# Patient Record
Sex: Female | Born: 1980 | Race: Black or African American | Hispanic: No | Marital: Married | Smoking: Never smoker
Health system: Southern US, Community
[De-identification: ages and names within clinical notes are randomized; demographics above are authoritative.]

## PROBLEM LIST (undated history)

## (undated) DIAGNOSIS — E669 Obesity, unspecified: Secondary | ICD-10-CM

## (undated) DIAGNOSIS — G4730 Sleep apnea, unspecified: Secondary | ICD-10-CM

## (undated) HISTORY — PX: TUBAL LIGATION: SHX77

## (undated) HISTORY — PX: OTHER SURGICAL HISTORY: SHX169

---

## 1999-03-20 HISTORY — PX: PARTIAL HYSTERECTOMY: SHX80

## 2005-08-28 ENCOUNTER — Emergency Department (HOSPITAL_COMMUNITY): Admission: EM | Admit: 2005-08-28 | Discharge: 2005-08-28 | Payer: Self-pay | Admitting: Emergency Medicine

## 2006-02-04 ENCOUNTER — Emergency Department (HOSPITAL_COMMUNITY): Admission: EM | Admit: 2006-02-04 | Discharge: 2006-02-04 | Payer: Self-pay | Admitting: Emergency Medicine

## 2007-03-10 ENCOUNTER — Emergency Department (HOSPITAL_COMMUNITY): Admission: EM | Admit: 2007-03-10 | Discharge: 2007-03-10 | Payer: Self-pay | Admitting: Emergency Medicine

## 2008-03-16 ENCOUNTER — Emergency Department (HOSPITAL_COMMUNITY): Admission: EM | Admit: 2008-03-16 | Discharge: 2008-03-16 | Payer: Self-pay | Admitting: Emergency Medicine

## 2008-04-23 ENCOUNTER — Emergency Department (HOSPITAL_COMMUNITY): Admission: EM | Admit: 2008-04-23 | Discharge: 2008-04-23 | Payer: Self-pay | Admitting: Emergency Medicine

## 2009-02-13 ENCOUNTER — Emergency Department (HOSPITAL_COMMUNITY): Admission: EM | Admit: 2009-02-13 | Discharge: 2009-02-13 | Payer: Self-pay | Admitting: Emergency Medicine

## 2010-02-23 ENCOUNTER — Emergency Department (HOSPITAL_COMMUNITY)
Admission: EM | Admit: 2010-02-23 | Discharge: 2010-02-23 | Payer: Self-pay | Source: Home / Self Care | Admitting: Emergency Medicine

## 2010-10-24 IMAGING — CR DG CHEST 2V
2 series · 2 of 2 positions shown · non-contrast
Comparison: None available

CLINICAL DATA: Chest pain under left breast radiating to back.

CHEST - 2 VIEW

[view not recorded (1 of 2)]
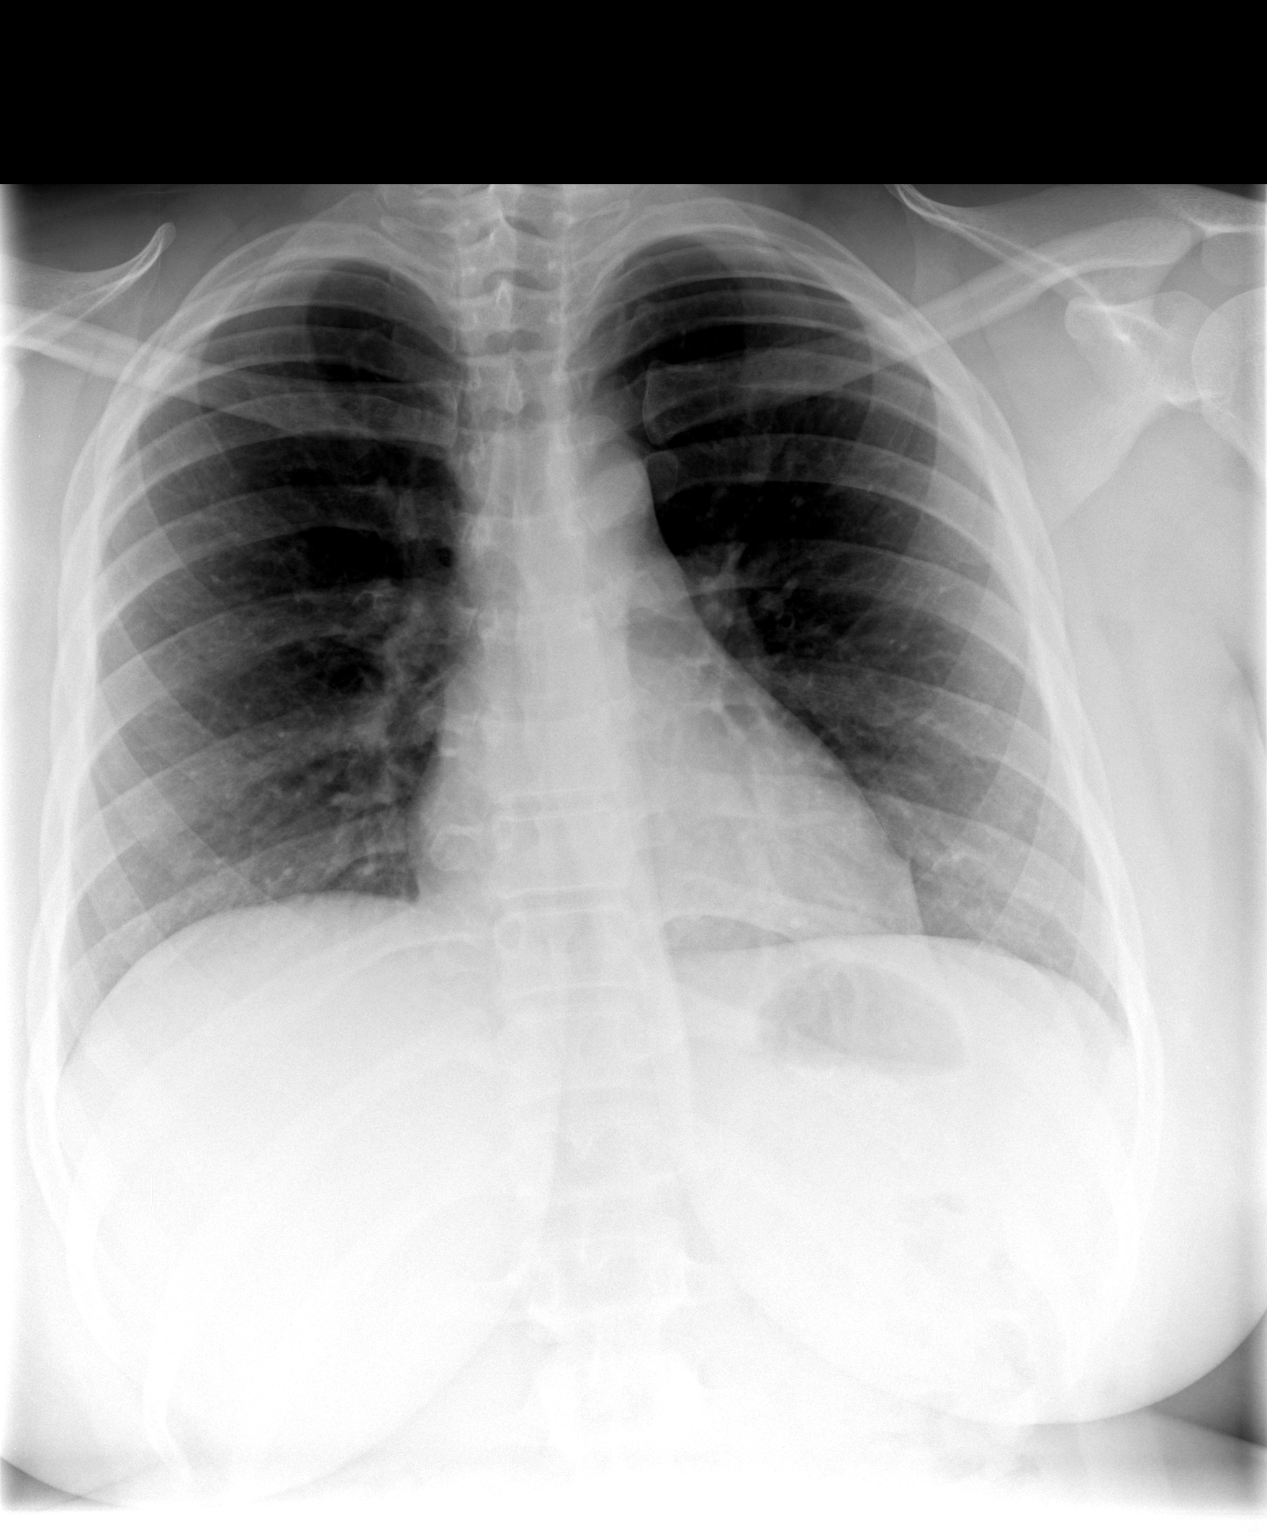

[view not recorded (2 of 2)]
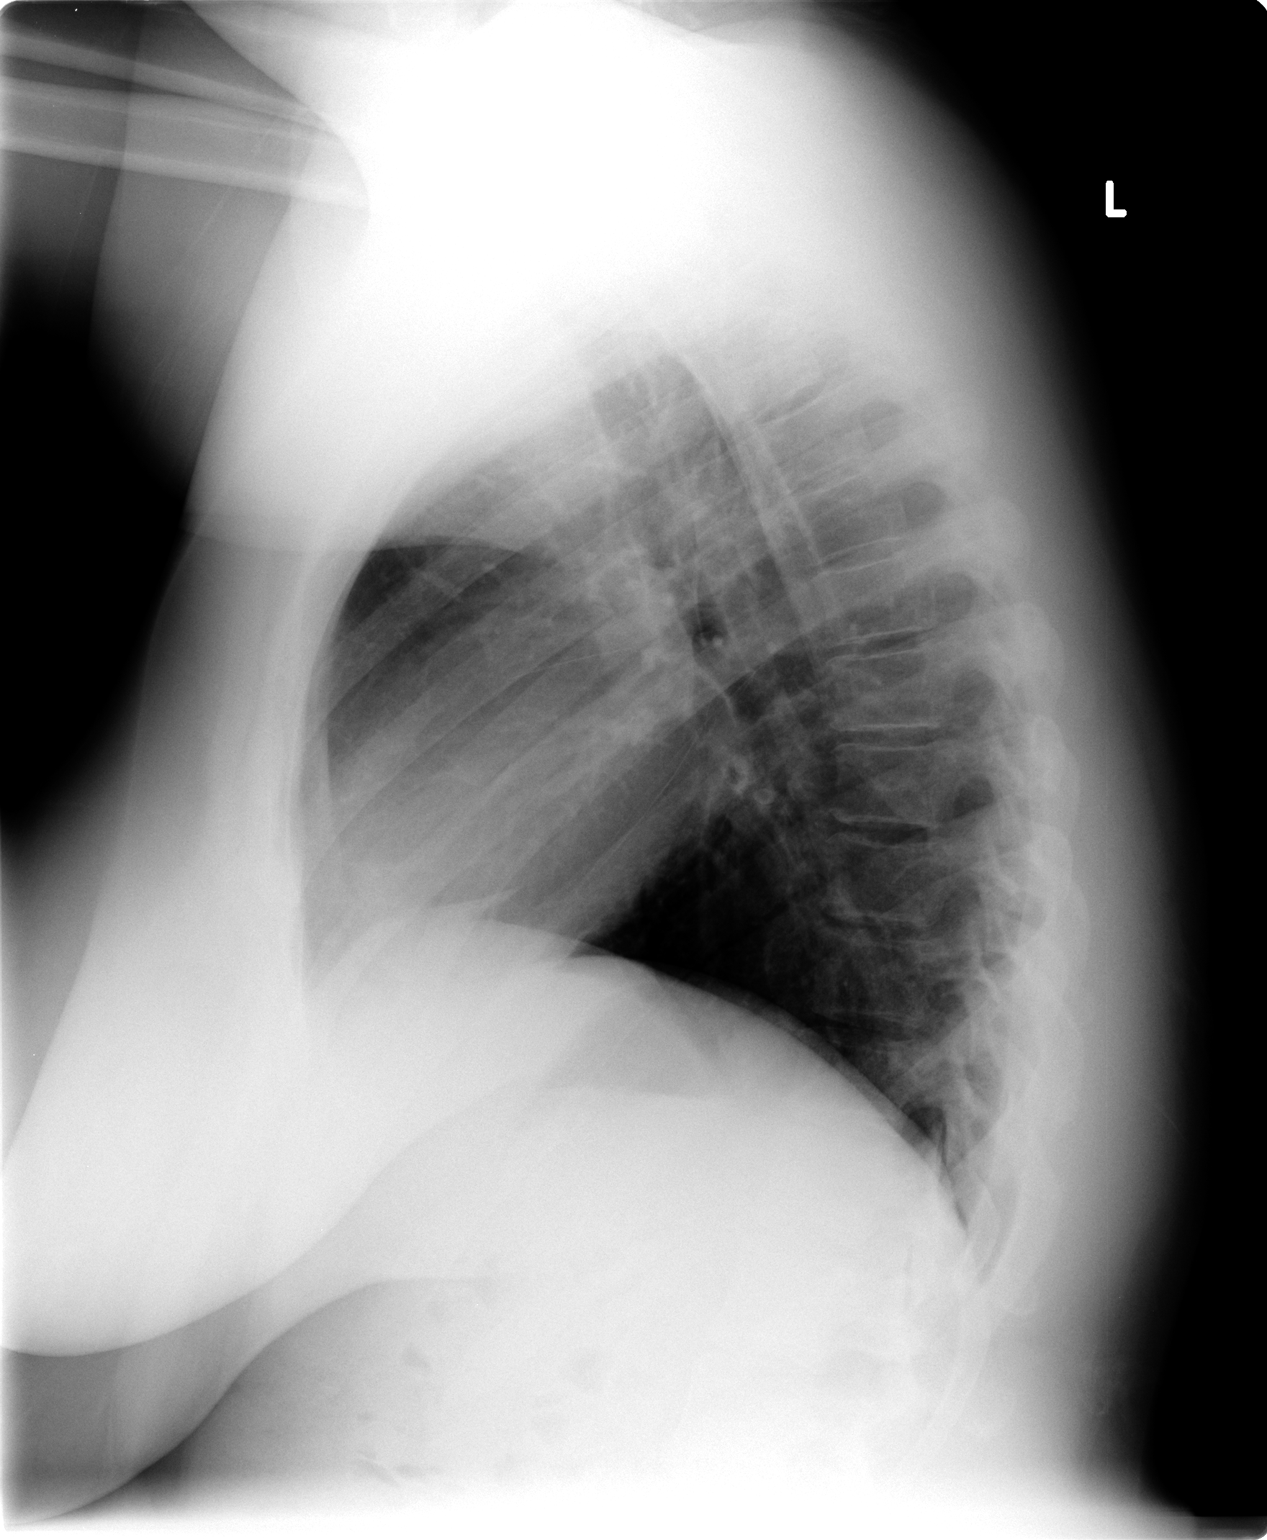

[2 of 2 positions shown; findings below may reference images not displayed]

FINDINGS: Lungs clear.  Cardiopericardial silhouette within normal
limits.  No airspace disease or effusion.  Trachea midline.
IMPRESSION: No active cardiopulmonary disease.

## 2013-10-09 ENCOUNTER — Encounter: Payer: Self-pay | Admitting: Neurology

## 2013-10-09 ENCOUNTER — Ambulatory Visit (INDEPENDENT_AMBULATORY_CARE_PROVIDER_SITE_OTHER): Payer: BC Managed Care – PPO | Admitting: Neurology

## 2013-10-09 VITALS — BP 128/83 | HR 78 | Temp 98.6°F | Ht 66.0 in | Wt 223.0 lb

## 2013-10-09 DIAGNOSIS — M545 Low back pain, unspecified: Secondary | ICD-10-CM | POA: Insufficient documentation

## 2013-10-09 DIAGNOSIS — R5381 Other malaise: Secondary | ICD-10-CM

## 2013-10-09 DIAGNOSIS — M5430 Sciatica, unspecified side: Secondary | ICD-10-CM

## 2013-10-09 DIAGNOSIS — R5382 Chronic fatigue, unspecified: Secondary | ICD-10-CM

## 2013-10-09 DIAGNOSIS — R5383 Other fatigue: Secondary | ICD-10-CM

## 2013-10-09 DIAGNOSIS — M5442 Lumbago with sciatica, left side: Secondary | ICD-10-CM

## 2013-10-09 NOTE — Patient Instructions (Signed)
Overall you are doing fairly well but I do want to suggest a few things today:   Remember to drink plenty of fluid, eat healthy meals and do not skip any meals. Try to eat protein with a every meal and eat a healthy snack such as fruit or nuts in between meals. Try to keep a regular sleep-wake schedule and try to exercise daily, particularly in the form of walking, 20-30 minutes a day, if you can.   As far as diagnostic testing:  1)I would like you to have a MRI of your lumbar spine. You will be called to schedule this 2)I would like you to see a sleep specialist to check for sleep apnea. You will be called to schedule this  We will call you with test results once we receive them.  Please also call us for any test results so we can go over those with you on the phone.  My clinical assistant and will answer any of your questions and relay your messages to me and also relay most of my messages to you.   Our phone number is [phone]. We also have an after hours call service for urgent matters and there is a physician on-call for urgent questions. For any emergencies you know to call 911 or go to the nearest emergency room

## 2013-10-09 NOTE — Progress Notes (Signed)
[facility]    Provider:  Dr Hosie PoissonSumner Referring Provider: Toma DeitersHasanaj, Caitlin A, Wood Primary Care Physician:  Caitlin Wood  CC:  Back pain  HPI:  Caitlin Wood is a 33 y.o. female here as a referral from Dr. Olena Wood for back pain  Started 9 to 10 years ago and is getting progressively worse. Described as midline lumbar back pain that radiates down the medial and posterior surface of her left leg. If she sits for a long time she will have a lot of pain when she stands up. No weakness. Prolonged walking or standing will cause a stabbing pain in her lower lumbar region. Gets pain when she moves a certain way in bed. No change in bowel or bladder. No difficulty with RLE or bilateral UE. No back trauma. Has tried steroid taper in the past, gave good relief but it was transient. Has tried narcotics with no relief. Has not completed PT.   Has difficulty with short term memory, trouble with recalling day to day events. Husband notes difficulty over the past 2 years. Has poor sleep overall. Notes a lot of snoring and apnea events. Notes constant fatigue.   Review of Systems: Out of a complete 14 system review, the patient complains of only the following symptoms, and all other reviewed systems are negative. + memory loss, numbness, snoring, feeling hot  History   Social History  . Marital Status: Married    Spouse Name: Caitlin NeedleMichael     Number of Children: 2  . Years of Education: 12+   Occupational History  . Not on file.   Social History Main Topics  . Smoking status: Never Smoker   . Smokeless tobacco: Never Used  . Alcohol Use: No  . Drug Use: No  . Sexual Activity: Not on file   Other Topics Concern  . Not on file   Social History Narrative   Patient lives at home with Caitlin NeedleMichael.    Patient has 2 children.    Patient is right handed.    Patient has a college education.     History reviewed. No pertinent family history.  History reviewed. No pertinent past  medical history.  Past Surgical History  Procedure Laterality Date  . None listed      Current Outpatient Prescriptions  Medication Sig Dispense Refill  . diclofenac (VOLTAREN) 75 MG EC tablet as needed.      . valACYclovir (VALTREX) 500 MG tablet as needed.       No current facility-administered medications for this visit.    Allergies as of 10/09/2013 - Review Complete 10/09/2013  Allergen Reaction Noted  . Codeine Nausea And Vomiting 10/09/2013    Vitals: There were no vitals taken for this visit. Last Weight:  Wt Readings from Last 1 Encounters:  No data found for Wt   Last Height:   Ht Readings from Last 1 Encounters:  No data found for Ht     Physical exam: Exam: Gen: NAD, conversant Eyes: anicteric sclerae, moist conjunctivae HENT: Atraumatic, oropharynx clear Neck: Trachea midline; supple,  Lungs: CTA, no wheezing, rales, rhonic                          CV: RRR, no MRG Abdomen: Soft, non-tender;  Extremities: No peripheral edema  Skin: Normal temperature, no rash,  Psych: Appropriate affect, pleasant  Neuro: MS: AA&Ox3, appropriately interactive, normal affect   Speech: fluent w/o paraphasic error  Memory: good recent  and remote recall  CN: PERRL, EOMI no nystagmus, no ptosis, sensation intact to LT V1-V3 bilat, face symmetric, no weakness, hearing grossly intact, palate elevates symmetrically, shoulder shrug 5/5 bilat,  tongue protrudes midline, no fasiculations noted.  Motor: normal bulk and tone Strength: 5/5  In all extremities  Coord: rapid alternating and point-to-point (FNF, HTS) movements intact.  Reflexes: symmetrical, bilat downgoing toes  Sens: LT intact in all extremities  Gait: posture, stance, stride and arm-swing normal. Tandem gait intact. Able to walk on heels and toes. Romberg absent.   Assessment:  After physical and neurologic examination, review of laboratory studies, imaging, neurophysiology testing and pre-existing  records, assessment will be reviewed on the problem list.  Plan:  Treatment plan and additional workup will be reviewed under Problem List.  1)Lumbar back pain 2)Fatigue 3)Cognitive decline 4)Obesity  33y/o woman presenting for initial evaluation of chronic lumbar back pain and cognitive decline. Physical exam is overall unremarkable. Based on history, back pain is consistent with a diagnosis of a lumbar radiculopathy. Will check MRI L spine. Suspect cognitive decline is related to possible sleep apnea. Will refer for sleep study. Follow up once workup completed.   Caitlin Cho, DO  [facility] [address] [place], [place] [address]  Phone [phone] Fax [phone]

## 2013-10-12 ENCOUNTER — Ambulatory Visit: Payer: BC Managed Care – PPO | Admitting: Neurology

## 2013-10-15 ENCOUNTER — Ambulatory Visit (INDEPENDENT_AMBULATORY_CARE_PROVIDER_SITE_OTHER): Payer: BC Managed Care – PPO

## 2013-10-15 DIAGNOSIS — R5383 Other fatigue: Secondary | ICD-10-CM

## 2013-10-15 DIAGNOSIS — M5442 Lumbago with sciatica, left side: Secondary | ICD-10-CM

## 2013-10-15 DIAGNOSIS — M5430 Sciatica, unspecified side: Secondary | ICD-10-CM

## 2013-10-15 DIAGNOSIS — R5381 Other malaise: Secondary | ICD-10-CM

## 2013-10-15 DIAGNOSIS — R5382 Chronic fatigue, unspecified: Secondary | ICD-10-CM

## 2013-10-16 ENCOUNTER — Other Ambulatory Visit: Payer: Self-pay | Admitting: Neurology

## 2013-10-16 DIAGNOSIS — R5383 Other fatigue: Secondary | ICD-10-CM

## 2013-10-23 ENCOUNTER — Telehealth: Payer: Self-pay | Admitting: *Deleted

## 2013-10-23 NOTE — Telephone Encounter (Signed)
Pt calling for MRI results. Dr. Hosie PoissonSumner out of the office, [facility] Dr. Terrace ArabiaYan. Please advise

## 2013-10-23 NOTE — Telephone Encounter (Signed)
I have called her, mild lumbar degenerative disc disease, no significant foraminal, or canal stenosis  She complains of low back pain, hard to walk, she just adopt a child, has no time for PT, I have suggested prn NSAIDs, and hot compression.  Abnormal MRI scan of the lumbar spine showing mild disc and facet degenerative changes at L4-5 and L5-S1 with mild bilateral foraminal narrowing but without definite compression.

## 2013-10-26 ENCOUNTER — Telehealth: Payer: Self-pay | Admitting: Neurology

## 2013-10-26 DIAGNOSIS — R400 Somnolence: Secondary | ICD-10-CM

## 2013-10-26 DIAGNOSIS — E669 Obesity, unspecified: Secondary | ICD-10-CM

## 2013-10-26 DIAGNOSIS — G4733 Obstructive sleep apnea (adult) (pediatric): Secondary | ICD-10-CM

## 2013-10-26 NOTE — Telephone Encounter (Incomplete)
..   Dr. Elspeth ChoPeter Sumner is referring [name], 33 y.o. female, for an attended sleep study.  Wt: 223 lbs. Ht: 66 in. BMI: 36.01  Diagnoses:  Morbid Obesity Snoring Fatigue Witnessed apneas   Medication List: Current Outpatient Prescriptions  Medication Sig Dispense Refill  . diclofenac (VOLTAREN) 75 MG EC tablet as needed.      . valACYclovir (VALTREX) 500 MG tablet as needed.       No current facility-administered medications for this visit.    This patient presents to Dr. Elspeth ChoPeter Sumner in evaluation for back pain.  During visit patient complains of cognitive decline, short term memory.  The patient is obese and husband has noted apneic events.  Patient has poor sleep with a lot of snoring.  Fatigue is noted to be chronic.  Please evaluate for an attended sleep study.  Insurance:  [employer] - Prior approval is not required

## 2013-10-30 NOTE — Telephone Encounter (Signed)
Sleep study request review: This patient has an underlying medical history of obesity, and back pain and is referred by Dr. Hosie PoissonSumner for an attended sleep study due to a report of snoring, daytime somnolence and witnessed apneas per husband. I will order a split-night sleep study and see the patient in sleep medicine consultation afterwards. Huston FoleySaima Christal Lagerstrom, MD, PhD [facility])

## 2013-12-10 ENCOUNTER — Ambulatory Visit (INDEPENDENT_AMBULATORY_CARE_PROVIDER_SITE_OTHER): Payer: BC Managed Care – PPO | Admitting: Neurology

## 2013-12-10 DIAGNOSIS — G4733 Obstructive sleep apnea (adult) (pediatric): Secondary | ICD-10-CM

## 2013-12-10 DIAGNOSIS — R400 Somnolence: Secondary | ICD-10-CM

## 2013-12-10 DIAGNOSIS — E669 Obesity, unspecified: Secondary | ICD-10-CM

## 2013-12-18 ENCOUNTER — Telehealth: Payer: Self-pay | Admitting: Neurology

## 2013-12-18 DIAGNOSIS — G4733 Obstructive sleep apnea (adult) (pediatric): Secondary | ICD-10-CM

## 2013-12-18 NOTE — Telephone Encounter (Signed)
Please call and notify patient that the recent sleep study confirmed the diagnosis of OSA. She did well with CPAP during the study with significant improvement of the respiratory events. Therefore, I would like start the patient on CPAP at home. I placed the order in the chart.   Arrange for CPAP set up at home through a DME company of patient's choice and fax/route report to PCP and referring MD (if other than PCP).   The patient will also need a follow up appointment with me in 6-8 weeks post set up that has to be scheduled; help the patient schedule this (in a follow-up slot).   Please re-enforce the importance of compliance with treatment and the need for us to monitor compliance data.   Once you have spoken to the patient and scheduled the return appointment, you may close this encounter, thanks,   Caitlin Pasko, MD, PhD [facility] ([facility])    

## 2013-12-24 ENCOUNTER — Encounter: Payer: Self-pay | Admitting: Neurology

## 2013-12-24 NOTE — Telephone Encounter (Signed)
Patient was contacted and provided the results of her overnight split study.  Patient was informed that Dr. Frances FurbishAthar had recommended CPAP therapy for home use and [facility] was selected as her DME provider.  Patient was instructed that a copy of the report would be mailed to her and a copy was routed to the referring physician; Dr. Elspeth ChoPeter Sumner.  Patient instructed to contact our office to schedule a follow up appointment 6-8 weeks after beginning CPAP therapy.

## 2015-09-28 ENCOUNTER — Telehealth: Payer: Self-pay

## 2015-09-28 ENCOUNTER — Other Ambulatory Visit (HOSPITAL_COMMUNITY): Payer: Self-pay | Admitting: General Surgery

## 2015-09-28 NOTE — Telephone Encounter (Signed)
LM for patient to bring in CPAP or SD card for appt tomorrow.

## 2015-09-29 ENCOUNTER — Ambulatory Visit (INDEPENDENT_AMBULATORY_CARE_PROVIDER_SITE_OTHER): Payer: BLUE CROSS/BLUE SHIELD | Admitting: Neurology

## 2015-09-29 ENCOUNTER — Encounter: Payer: Self-pay | Admitting: Neurology

## 2015-09-29 VITALS — BP 125/88 | HR 88 | Ht 64.0 in | Wt 270.0 lb

## 2015-09-29 DIAGNOSIS — R419 Unspecified symptoms and signs involving cognitive functions and awareness: Secondary | ICD-10-CM | POA: Diagnosis not present

## 2015-09-29 DIAGNOSIS — G4733 Obstructive sleep apnea (adult) (pediatric): Secondary | ICD-10-CM

## 2015-09-29 NOTE — Patient Instructions (Signed)
We will set you up with CPAP at home.  Follow up in 3 months.

## 2015-09-29 NOTE — Progress Notes (Signed)
Subjective:    Patient ID: Caitlin Wood is a 35 y.o. female.  HPI     Caitlin FoleySaima Damere Brandenburg, MD, PhD [facility] [address], [address] [address] [place], [place] [id number]  Caitlin Wood is a 35 year old right-handed woman with an underlying medical history there of low back pain and obesity who presents for initial consultation for her sleep disturbance, in particular her obstructive sleep apnea. The patient is unaccompanied today. She was referred for a sleep study in 2015 by Dr. Elspeth ChoPeter Sumner for a complaint of snoring and excessive daytime somnolence and memory issues. She had a split-night sleep study on 12/10/2013. I went over her test results with her in detail today. Sleep efficiency at baseline was 43.3% with a sleep latency of 135.5 minutes and wake after sleep onset of 8 minutes with moderate sleep fragmentation noted. She had no significant PLMS, EKG or EEG changes. Mild to moderate snoring was noted. Total AHI was 25 per hour, average oxygen saturation was 93%, nadir was 77% during REM sleep. She was then titrated on CPAP. Sleep efficiency was 93% during the later part of the study. She had an average oxygen saturation of 95%, nadir was 88%. CPAP was titrated from 5 cm to 7 cm. AHI was 1.6 per hour at the final pressure. REM sleep was achieved, supine REM sleep was not achieved. Based on her test results and her sleep-related complaints I prescribed CPAP therapy for home use. The patient did not return for an appointment in sleep clinic after that. She reports ongoing short-term memory issues. She has gained weight in the past couple of years. Her current weight is 270 pounds. She works full-time. She has 3 children, ages 7619, 917 and nearly 935 years old. Her Epworth sleepiness score is 9 out of 24, her fatigue score is 54 out of 63. She denies nocturia and restless leg symptoms. She has had some nocturnal reflux symptoms. She is in the process of being evaluated for bariatric  surgery. She would be willing to get on CPAP therapy at this time. Bedtime is around 9, wakeup time is around 5. She drinks alcohol very rarely, maybe once or so per year, caffeine not on a regular basis, and does not smoke. She lives at home with her husband and 3 children. She has seen Dr. Andrey CampanileWilson for possible gastric bypass surgery.  Her Past Medical History Is Significant For: History reviewed. No pertinent past medical history.  Her Past Surgical History Is Significant For: Past Surgical History  Procedure Laterality Date  . None listed    . Partial hysterectomy  2001  . Cesarean section  1999    x 1    Her Family History Is Significant For: History reviewed. No pertinent family history.  Her Social History Is Significant For: Social History   Social History  . Marital Status: Married    Spouse Name: Casimiro NeedleMichael   . Number of Children: 2  . Years of Education: 16   Occupational History  .      [employer]   Social History Main Topics  . Smoking status: Never Smoker   . Smokeless tobacco: Never Used  . Alcohol Use: No     Comment:  rarely  . Drug Use: No  . Sexual Activity: Not Asked   Other Topics Concern  . None   Social History Narrative   Patient lives at home with Casimiro NeedleMichael.    Patient has 2 children.    Patient is  right handed.    Patient has a college education.    Caffeine seldom, maybe a soda    Her Allergies Are:  Allergies  Allergen Reactions  . Codeine Nausea And Vomiting  :   Her Current Medications Are:  Outpatient Encounter Prescriptions as of 09/29/2015  Medication Sig  . valACYclovir (VALTREX) 500 MG tablet as needed.  . [DISCONTINUED] diclofenac (VOLTAREN) 75 MG EC tablet as needed.   No facility-administered encounter medications on file as of 09/29/2015.  :  Review of Systems:  Out of a complete 14 point review of systems, all are reviewed and negative with the exception of these symptoms as listed below:   Review of Systems   Neurological: Positive for headaches.   Patient stated she has OSA, was ordered CPAP but never got it. She wants bariatric surgery and must use CPAP to have surgery.  Objective:  Neurologic Exam Epworth Sleepiness Scale 0= would never doze 1= slight chance of dozing 2= moderate chance of dozing 3= high chance of dozing  Sitting and reading: 2 Watching TV: 1 Sitting inactive in a public place (ex. Theater or meeting): 0 As a passenger in a car for an hour without a break: 3 Lying down to rest in the afternoon: 3 Sitting and talking to someone: 0 Sitting quietly after lunch (no alcohol): 0 In a car, while stopped in traffic: 0 Total: 9  Physical Exam Physical Examination:   Filed Vitals:   09/29/15 1536  BP: 125/88  Pulse: 88   General Examination: The patient is a very pleasant 35 y.o. female in no acute distress. She appears well-developed and well-nourished and well groomed. She is morbidly obese.  HEENT: Normocephalic, atraumatic, pupils are equal, round and reactive to light and accommodation. Extraocular tracking is good without limitation to gaze excursion or nystagmus noted. Normal smooth pursuit is noted. Hearing is grossly intact. Face is symmetric with normal facial animation and normal facial sensation. Speech is clear with no dysarthria noted. There is no hypophonia. There is no lip, neck/head, jaw or voice tremor. Neck is supple with full range of passive and active motion. There are no carotid bruits on auscultation. Oropharynx exam reveals: mild mouth dryness, good dental hygiene and marked airway crowding, due to large tonsils are 3+ and wider tongue. Uvula is actually small. Mallampati is class II. Tongue protrudes centrally and palate elevates symmetrically. Neck size is 17.5 inches. She has a Mild overbite.    Chest: Clear to auscultation without wheezing, rhonchi or crackles noted.  Heart: S1+S2+0, regular and normal without murmurs, rubs or gallops noted.    Abdomen: Soft, non-tender and non-distended with normal bowel sounds appreciated on auscultation.  Extremities: There is no pitting edema in the distal lower extremities bilaterally. Pedal pulses are intact. She has a scar on her left forearm and wrist, from an injury.  Skin: Warm and dry without trophic changes noted. There are no varicose veins.  Musculoskeletal: exam reveals no obvious joint deformities, tenderness or joint swelling or erythema.   Neurologically:  Mental status: The patient is awake, alert and oriented in all 4 spheres. Her immediate and remote memory, attention, language skills and fund of knowledge are appropriate. There is no evidence of aphasia, agnosia, apraxia or anomia. Speech is clear with normal prosody and enunciation. Thought process is linear. Mood is normal and affect is normal.  Cranial nerves II - XII are as described above under HEENT exam. In addition: shoulder shrug is normal with equal shoulder height  noted. Motor exam: Normal bulk, strength and tone is noted. There is no drift, tremor or rebound. Romberg is negative. Reflexes are 1+ throughout. Babinski: Toes are flexor bilaterally. Fine motor skills and coordination: intact with normal finger taps, normal hand movements, normal rapid alternating patting, normal foot taps and normal foot agility.  Cerebellar testing: No dysmetria or intention tremor on finger to nose testing. Heel to shin is unremarkable bilaterally. There is no truncal or gait ataxia.  Sensory exam: intact to light touch, pinprick, vibration, temperature sense in the upper and lower extremities.  Gait, station and balance: She stands easily. No veering to one side is noted. No leaning to one side is noted. Posture is age-appropriate and stance is narrow based. Gait shows normal stride length and normal pace. No problems turning are noted. Tandem walk is difficult for her, due to body habitus.   Assessment and Plan:  In summary, CALLISTA HOH is a very pleasant 35 y.o.-year old female with an underlying medical history there of low back pain and morbid obesity, who presents for initial consultation of her moderate obstructive sleep apnea. She had a split-night sleep study in September 2015 which showed moderate OSA. She did well with CPAP of 7 cm at the time but unfortunately she has gained quite a bit of weight since then. I will prescribe home CPAP therapy at a pressure of 8 cm via medium nasal mask.  I had a long chat with the patient about my findings and the diagnosis of OSA, its prognosis and treatment options. We talked about medical treatments, surgical interventions and non-pharmacological approaches. I explained in particular the risks and ramifications of untreated moderate to severe OSA, especially with respect to developing cardiovascular disease down the Road, including congestive heart failure, difficult to treat hypertension, cardiac arrhythmias, or stroke. Even type 2 diabetes has, in part, been linked to untreated OSA. Symptoms of untreated OSA include daytime sleepiness, memory problems, mood irritability and mood disorder such as depression and anxiety, lack of energy, as well as recurrent headaches, especially morning headaches. We talked about trying to maintain a healthy lifestyle in general, as well as the importance of weight control.  I will see her back in about 3 months for compliance and follow-up for sleep apnea. We will request [facility] in [place] for her DME. I explained the importance of being compliant with PAP treatment, not only for insurance purposes but primarily to improve Her symptoms, and for the patient's long term health benefit, including to reduce Her cardiovascular risks. I answered all her questions today and the patient was in agreement.   Caitlin Foley, MD, PhD

## 2015-10-04 ENCOUNTER — Ambulatory Visit (HOSPITAL_COMMUNITY): Payer: Self-pay

## 2015-10-12 ENCOUNTER — Ambulatory Visit (INDEPENDENT_AMBULATORY_CARE_PROVIDER_SITE_OTHER): Payer: BLUE CROSS/BLUE SHIELD | Admitting: Licensed Clinical Social Worker

## 2015-10-12 DIAGNOSIS — F5081 Binge eating disorder: Secondary | ICD-10-CM

## 2015-10-13 ENCOUNTER — Other Ambulatory Visit (HOSPITAL_COMMUNITY): Payer: Self-pay

## 2015-10-20 ENCOUNTER — Ambulatory Visit (HOSPITAL_COMMUNITY)
Admission: RE | Admit: 2015-10-20 | Discharge: 2015-10-20 | Disposition: A | Discharge status: Home / Self Care | Payer: BLUE CROSS/BLUE SHIELD | Source: Ambulatory Visit | Attending: General Surgery | Admitting: General Surgery

## 2015-10-20 ENCOUNTER — Other Ambulatory Visit: Payer: Self-pay

## 2015-10-20 ENCOUNTER — Ambulatory Visit (HOSPITAL_COMMUNITY): Payer: Self-pay

## 2015-10-20 DIAGNOSIS — R935 Abnormal findings on diagnostic imaging of other abdominal regions, including retroperitoneum: Secondary | ICD-10-CM | POA: Diagnosis not present

## 2015-10-31 ENCOUNTER — Ambulatory Visit: Payer: BLUE CROSS/BLUE SHIELD | Admitting: Licensed Clinical Social Worker

## 2015-11-02 ENCOUNTER — Ambulatory Visit: Payer: BLUE CROSS/BLUE SHIELD | Admitting: Licensed Clinical Social Worker

## 2015-11-07 ENCOUNTER — Ambulatory Visit (INDEPENDENT_AMBULATORY_CARE_PROVIDER_SITE_OTHER): Payer: BLUE CROSS/BLUE SHIELD | Admitting: Licensed Clinical Social Worker

## 2015-11-09 ENCOUNTER — Ambulatory Visit: Payer: BLUE CROSS/BLUE SHIELD | Admitting: Dietician

## 2015-11-10 ENCOUNTER — Ambulatory Visit: Payer: BLUE CROSS/BLUE SHIELD | Admitting: Dietician

## 2015-12-05 ENCOUNTER — Encounter: Payer: BLUE CROSS/BLUE SHIELD | Attending: General Surgery | Admitting: Dietician

## 2015-12-05 DIAGNOSIS — Z713 Dietary counseling and surveillance: Secondary | ICD-10-CM | POA: Diagnosis not present

## 2015-12-05 DIAGNOSIS — Z01818 Encounter for other preprocedural examination: Secondary | ICD-10-CM | POA: Diagnosis not present

## 2015-12-05 NOTE — Progress Notes (Signed)
  Pre-Op Assessment Visit:  Pre-Operative RYGB Surgery  Medical Nutrition Therapy:  Appt start time: 0955   End time:  1030.  Patient was seen on 12/05/2015 for Pre-Operative Nutrition Assessment. Assessment and [facility] coordinator on 12/05/2015.   Preferred Learning Style:   No preference indicated   Learning Readiness:   Ready  Handouts given during visit include:  Pre-Op Goals Bariatric Surgery Protein Shakes   During the appointment today the following Pre-Op Goals were reviewed with the patient: Maintain or lose weight as instructed by your surgeon Make healthy food choices Begin to limit portion sizes Limited concentrated sugars and fried foods Keep fat/sugar in the single digits per serving on   food labels Practice CHEWING your food  (aim for 30 chews per bite or until applesauce consistency) Practice not drinking 15 minutes before, during, and 30 minutes after each meal/snack Avoid all carbonated beverages  Avoid/limit caffeinated beverages  Avoid all sugar-sweetened beverages Consume 3 meals per day; eat every 3-5 hours Make a list of non-food related activities Aim for 64-100 ounces of FLUID daily  Aim for at least 60-80 grams of PROTEIN daily Look for a liquid protein source that contain ?15 g protein and ?5 g carbohydrate  (ex: shakes, drinks, shots)  Demonstrated degree of understanding via:  Teach Back  Teaching Method Utilized:  Visual Auditory Hands on  Barriers to learning/adherence to lifestyle change: none  Patient to call the [facility] to enroll in Pre-Op and Post-Op Nutrition Education when surgery date is scheduled.

## 2016-05-04 ENCOUNTER — Telehealth: Payer: Self-pay | Admitting: Neurology

## 2016-05-04 NOTE — Telephone Encounter (Signed)
Spoke to pt.  She had never started cpap.  She never received call.  I told her I would call [facility].  I spoke to Huntley DecSara, and Onalee HuaDavid, RT is not there today.  I faxed over the order from 09/2015 and will check with them on Monday.  I relayed this to pt.  Will let her know what I find out on Monday.

## 2016-05-04 NOTE — Telephone Encounter (Signed)
Patient last seen in July 2017, was supposed to start CPAP then, per note, [facility] [place] was the DME. Please notify patient. She was supposed to have 3 mon FU in 10/17 after starting CPAP.

## 2016-05-04 NOTE — Telephone Encounter (Signed)
Pt called wanting to know where RX for CPAP was sent. She is having weight loss surgery and needs CPAP before she can have it. Please call

## 2016-05-07 NOTE — Telephone Encounter (Signed)
I [facility] to [facility]aitlin [facility] at [facility]. She needs demographics and last OV note. Will fax to her.  I called pt and explained this to her and cpap compliance expectations. An appt was made for 07/05/16 at 10:30am. Pt verbalized understanding.

## 2016-05-11 NOTE — Progress Notes (Signed)
Please place orders in EPIC as patient is being scheduled for a pre-op appointment! Thank you! 

## 2016-05-14 ENCOUNTER — Encounter: Payer: BLUE CROSS/BLUE SHIELD | Attending: General Surgery | Admitting: Skilled Nursing Facility1

## 2016-05-14 DIAGNOSIS — Z713 Dietary counseling and surveillance: Secondary | ICD-10-CM | POA: Insufficient documentation

## 2016-05-14 DIAGNOSIS — E669 Obesity, unspecified: Secondary | ICD-10-CM

## 2016-05-14 NOTE — Progress Notes (Signed)
  Pre-Operative Nutrition Class:  Appt start time: 0223   End time:  1830.  Patient was seen on 05/14/2016 for Pre-Operative Bariatric Surgery Education at [facility].   Surgery date: 05/28/2016 Surgery type: RYGB Start weight at [facility]: 274.5 pounds Weight today: 281.2  TANITA  BODY COMP RESULTS  05/14/2016   BMI (kg/m^2) 48.3   Fat Mass (lbs) 148.8   Fat Free Mass (lbs) 132.4   Total Body Water (lbs) 98.6   Samples given per MNT protocol. Patient educated on appropriate usage: Bariatric Advantage Multivitamin Lot # [id number] Exp:08/18   Celebrate Vitamins Calcium Citrate Lot # [id number] Exp: may-16-2018  Unjury Protein Powder Lot # 08/2017 Exp: [id number]  Premeir Protein: Lot: [id number] Exp: nov-24-2018  Unjury protein: Exp: 01/16/17 Lot: [id number]  The following the learning objectives were met by the patient during this course:  Identify Pre-Op Dietary Goals and will begin 2 weeks pre-operatively  Identify appropriate sources of fluids and proteins   State protein recommendations and appropriate sources pre and post-operatively  Identify Post-Operative Dietary Goals and will follow for 2 weeks post-operatively  Identify appropriate multivitamin and calcium sources  Describe the need for physical activity post-operatively and will follow MD recommendations  State when to call healthcare provider regarding medication questions or post-operative complications  Handouts given during class include:  Pre-Op Bariatric Surgery Diet Handout  Protein Shake Handout  Post-Op Bariatric Surgery Nutrition Handout  BELT Program Information Flyer  Support Group Information Flyer  [facility] Bariatric Supplements Price List  Follow-Up Plan: [facility] for diet advancement per MD.

## 2016-05-16 ENCOUNTER — Encounter: Payer: Self-pay | Admitting: Skilled Nursing Facility1

## 2016-05-17 ENCOUNTER — Ambulatory Visit: Payer: Self-pay | Admitting: General Surgery

## 2016-05-17 NOTE — H&P (Signed)
Caitlin Wood 05/17/2016 10:03 AM Location: [facility] Patient #: [id number] DOB: 1981-03-01 Married / Language: English / Race: Black or African American Female  History of Present Illness Caitlin Hiss M. [name] MD; 05/17/2016 1:12 PM) The patient is a 36 year old female who presents for a pre-op visit. She comes in today for her preoperative visit. I initially met her in June 2017. Her initial visit weight was 270 pounds. She has been approved for laparoscopic Roux-en-Y gastric bypass. She denies any medical changes since I initially met her. She denies any trips the emergency room or hospital. She denies any reflux, chest pain, chest pressure, source of breath, orthopnea, dyspnea on exertion, paroxysmal nocturnal dyspnea, melena or hematochezia. She denies any dysuria or hematuria. She denies any vision changes.  Her upper GI was within normal limits. Her bariatric evaluation labs in October 2017 were unremarkable. No anemia. no vitamin deficiencies. Normal lipid panel.  She had a history of a positive sleep study but had not been set up on CPAP when I initially saw her. She has been on CPAP for about a week. She reports more energy during the daytime and a better night's rest.  Review systems 12 point review systems was performed and all systems are negative except for what is mentioned in HPI   Problem List/Past Medical Caitlin Hiss M. Redmond Pulling, MD; 05/17/2016 1:14 PM) MORBID OBESITY WITH BMI OF 45.0-49.9, ADULT (E66.01) OSA (OBSTRUCTIVE SLEEP APNEA) (G47.33)  Allergies Caitlin Hiss M. Redmond Pulling, MD; 05/17/2016 1:14 PM) Codeine Sulfate *ANALGESICS - OPIOID* Nausea, Vomiting. No Known Drug Allergies 09/08/2015  Medication History Caitlin Hiss M. Redmond Pulling, MD; 05/17/2016 1:14 PM) ValACYclovir HCl (500MG Tablet, Oral) Active. No Current Medications (Taken starting 05/17/2016) Medications Reconciled Protonix (40MG Tablet DR, 1 (one) Tablet Oral daily, Taken starting 05/17/2016) Active. Zofran  ODT (4MG Tablet Disint, 1 (one) Tablet Oral every six hours, as needed, Taken starting 05/17/2016) Active.    Vitals Caitlin Wood CMA; 05/17/2016 10:09 AM) 05/17/2016 10:06 AM Weight: 283.6 lb Height: 64in Body Surface Area: 2.27 m Body Mass Index: 48.68 kg/m  Temp.: 98.35F  Pulse: 66 (Regular)  BP: 124/70 (Sitting, Left Arm, Large)      Physical Exam Caitlin Hiss M. Nakari Bracknell MD; 05/17/2016 1:10 PM)  General Mental Status-Alert. General Appearance-Consistent with stated age. Hydration-Well hydrated. Voice-Normal.  Head and Neck Head-normocephalic, atraumatic with no lesions or palpable masses. Trachea-midline. Thyroid Gland Characteristics - normal size and consistency.  Eye Eyeball - Bilateral-Normal. Sclera/Conjunctiva - Bilateral-No scleral icterus.  Chest and Lung Exam Chest and lung exam reveals -quiet, even and easy respiratory effort with no use of accessory muscles and on auscultation, normal breath sounds, no adventitious sounds and normal vocal resonance. Inspection Chest Wall - Normal. Back - normal.  Breast - Did not examine.  Cardiovascular Cardiovascular examination reveals -normal heart sounds, regular rate and rhythm with no murmurs and normal pedal pulses bilaterally.  Abdomen Inspection Inspection of the abdomen reveals - No Hernias. Palpation/Percussion Palpation and Percussion of the abdomen reveal - Soft, Non Tender, No Rebound tenderness, No Rigidity (guarding) and No hepatosplenomegaly. Auscultation Auscultation of the abdomen reveals - Bowel sounds normal.  Peripheral Vascular Upper Extremity Palpation - Pulses bilaterally normal.  Neurologic Neurologic evaluation reveals -alert and oriented x 3 with no impairment of recent or remote memory. Mental Status-Normal.  Neuropsychiatric The patient's mood and affect are described as -normal. Judgment and Insight-insight is appropriate concerning matters  relevant to self.  Musculoskeletal Normal Exam - Left-Upper Extremity Strength Normal and Lower Extremity  Strength Normal. Normal Exam - Right-Upper Extremity Strength Normal and Lower Extremity Strength Normal.  Lymphatic Head & Neck  General Head & Neck Lymphatics: Bilateral - Description - Normal. Axillary - Did not examine. Femoral & Inguinal - Did not examine.    Assessment & Plan Caitlin Hiss M. Michaelah Credeur MD; 05/17/2016 1:13 PM)  MORBID OBESITY WITH BMI OF 45.0-49.9, ADULT (E66.01) Impression: We reviewed her preoperative workup. We discussed the results of her imaging studies and labs. We rediscussed the typical hospital course as well as the typical recovery period we rediscussed proper eating techniques after surgery. She states that she has had trouble with tolerating pain medicine in the past. Therefore we will not give her postoperative pain prescription today. We will see how she does the hospital with tolerating oxycodone. We discussed the importance of the preoperative meal plan. We discussed the importance of doing the bowel prep the day before surgery. All of her questions were asked and answered.  Current Plans Pt Education - EMW_preopbariatric OSA (OBSTRUCTIVE SLEEP APNEA) (G47.33) Impression: cont CPAP  Caitlin Ruff. Redmond Pulling, MD, FACS General, Bariatric, & Minimally Invasive Surgery [facility], [place]

## 2016-05-25 ENCOUNTER — Encounter (HOSPITAL_COMMUNITY)
Admission: RE | Admit: 2016-05-25 | Discharge: 2016-05-25 | Disposition: A | Discharge status: Home / Self Care | Payer: BLUE CROSS/BLUE SHIELD | Source: Ambulatory Visit | Attending: General Surgery | Admitting: General Surgery

## 2016-05-25 ENCOUNTER — Encounter (HOSPITAL_COMMUNITY): Payer: Self-pay

## 2016-05-25 HISTORY — DX: Obesity, unspecified: E66.9

## 2016-05-25 LAB — CBC WITH DIFFERENTIAL/PLATELET
BASOS PCT: 0 %
Basophils Absolute: 0.1 10*3/uL (ref 0.0–0.1)
Eosinophils Absolute: 0.5 10*3/uL (ref 0.0–0.7)
Eosinophils Relative: 4 %
HEMATOCRIT: 39.6 % (ref 36.0–46.0)
HEMOGLOBIN: 12.9 g/dL (ref 12.0–15.0)
Lymphocytes Relative: 29 %
Lymphs Abs: 3.4 10*3/uL (ref 0.7–4.0)
MCH: 28.3 pg (ref 26.0–34.0)
MCHC: 32.6 g/dL (ref 30.0–36.0)
MCV: 86.8 fL (ref 78.0–100.0)
MONOS PCT: 7 %
Monocytes Absolute: 0.8 10*3/uL (ref 0.1–1.0)
NEUTROS ABS: 6.9 10*3/uL (ref 1.7–7.7)
NEUTROS PCT: 60 %
Platelets: 347 10*3/uL (ref 150–400)
RBC: 4.56 MIL/uL (ref 3.87–5.11)
RDW: 12.9 % (ref 11.5–15.5)
WBC: 11.5 10*3/uL — ABNORMAL HIGH (ref 4.0–10.5)

## 2016-05-25 LAB — COMPREHENSIVE METABOLIC PANEL
ALK PHOS: 68 U/L (ref 38–126)
ALT: 16 U/L (ref 14–54)
AST: 22 U/L (ref 15–41)
Albumin: 4.1 g/dL (ref 3.5–5.0)
Anion gap: 6 (ref 5–15)
BUN: 15 mg/dL (ref 6–20)
CALCIUM: 9.5 mg/dL (ref 8.9–10.3)
CHLORIDE: 105 mmol/L (ref 101–111)
CO2: 28 mmol/L (ref 22–32)
Creatinine, Ser: 0.89 mg/dL (ref 0.44–1.00)
GFR calc non Af Amer: >60 mL/min (ref >60)
Glucose, Bld: 97 mg/dL (ref 65–99)
Potassium: 4.7 mmol/L (ref 3.5–5.1)
SODIUM: 139 mmol/L (ref 135–145)
Total Bilirubin: 0.8 mg/dL (ref 0.3–1.2)
Total Protein: 8 g/dL (ref 6.5–8.1)

## 2016-05-25 NOTE — Patient Instructions (Addendum)
Caitlin Wood  05/25/2016   Your procedure is scheduled on: 05/28/16  Report to [facility] Main  Entrance take [facility]  elevators to 3rd floor to  Short Stay Center at 8:45 AM.  Call this number if you have problems the morning of surgery [phone]   Remember: ONLY 1 PERSON MAY GO WITH YOU TO SHORT STAY TO GET  READY MORNING OF YOUR SURGERY.  Do not eat food or drink liquids :After Midnight.  Follow instructions from Dr. Tawana Scale office regarding diet before surgery.     Take these medicines the morning of surgery with A SIP OF WATER: none                                You may not have any metal on your body including hair pins and              piercings  Do not wear jewelry, make-up, lotions, powders or perfumes, deodorant             Do not wear nail polish.  Do not shave  48 hours prior to surgery.              Men may shave face and neck.   Please bring mask and tubing from CPAP machine morning of surgery.   Do not bring valuables to the hospital. [facility] IS NOT             RESPONSIBLE   FOR VALUABLES.  Contacts, dentures or bridgework may not be worn into surgery.  Leave suitcase in the car. After surgery it may be brought to your room.              Please read over the following fact sheets you were given: _____________________________________________________________________             [facility] - Preparing for Surgery Before surgery, you can play an important role.  Because skin is not sterile, your skin needs to be as free of germs as possible.  You can reduce the number of germs on your skin by washing with CHG (chlorahexidine gluconate) soap before surgery.  CHG is an antiseptic cleaner which kills germs and bonds with the skin to continue killing germs even after washing. Please DO NOT use if you have an allergy to CHG or antibacterial soaps.  If your skin becomes reddened/irritated stop using the CHG and inform your nurse when you  arrive at Short Stay. Do not shave (including legs and underarms) for at least 48 hours prior to the first CHG shower.  You may shave your face/neck. Please follow these instructions carefully:  1.  Shower with CHG Soap the night before surgery and the  morning of Surgery.  2.  If you choose to wash your hair, wash your hair first as usual with your  normal  shampoo.  3.  After you shampoo, rinse your hair and body thoroughly to remove the  shampoo.                           4.  Use CHG as you would any other liquid soap.  You can apply chg directly  to the skin and wash  Gently with a scrungie or clean washcloth.  5.  Apply the CHG Soap to your body ONLY FROM THE NECK DOWN.   Do not use on face/ open                           Wound or open sores. Avoid contact with eyes, ears mouth and genitals (private parts).                       Wash face,  Genitals (private parts) with your normal soap.             6.  Wash thoroughly, paying special attention to the area where your surgery  will be performed.  7.  Thoroughly rinse your body with warm water from the neck down.  8.  DO NOT shower/wash with your normal soap after using and rinsing off  the CHG Soap.                9.  Pat yourself dry with a clean towel.            10.  Wear clean pajamas.            11.  Place clean sheets on your bed the night of your first shower and do not  sleep with pets. Day of Surgery : Do not apply any lotions/deodorants the morning of surgery.  Please wear clean clothes to the hospital/surgery center.  FAILURE TO FOLLOW THESE INSTRUCTIONS MAY RESULT IN THE CANCELLATION OF YOUR SURGERY PATIENT SIGNATURE_________________________________  NURSE SIGNATURE__________________________________  ________________________________________________________________________     CLEAR LIQUID DIET   Foods Allowed                                                                     Foods  Excluded  Coffee and tea, regular and decaf                             liquids that you cannot  Plain Jell-O in any flavor                                             see through such as: Fruit ices (not with fruit pulp)                                     milk, soups, orange juice  Iced Popsicles                                    All solid food Carbonated beverages, regular and diet                                    Cranberry, grape and apple juices Sports drinks like Gatorade Lightly seasoned clear broth or consume(fat free) Sugar, honey syrup  Sample Menu Breakfast                                Lunch                                     Supper Cranberry juice                    Beef broth                            Chicken broth Jell-O                                     Grape juice                           Apple juice Coffee or tea                        Jell-O                                      Popsicle                                                Coffee or tea                        Coffee or tea  _____________________________________________________________________

## 2016-05-25 NOTE — Pre-Procedure Instructions (Signed)
10-20-15 CXR, EKG (epic)

## 2016-05-28 ENCOUNTER — Encounter (HOSPITAL_COMMUNITY): Payer: Self-pay | Admitting: *Deleted

## 2016-05-28 ENCOUNTER — Inpatient Hospital Stay (HOSPITAL_COMMUNITY)
Admission: RE | Admit: 2016-05-28 | Discharge: 2016-05-30 | DRG: 621 | Disposition: A | Discharge status: Home / Self Care | Payer: BLUE CROSS/BLUE SHIELD | Source: Ambulatory Visit | Attending: General Surgery | Admitting: General Surgery

## 2016-05-28 ENCOUNTER — Inpatient Hospital Stay (HOSPITAL_COMMUNITY): Payer: BLUE CROSS/BLUE SHIELD | Admitting: Certified Registered Nurse Anesthetist

## 2016-05-28 ENCOUNTER — Encounter (HOSPITAL_COMMUNITY)
Admission: RE | Disposition: A | Discharge status: Home / Self Care | Payer: Self-pay | Source: Ambulatory Visit | Attending: General Surgery

## 2016-05-28 DIAGNOSIS — Z6841 Body Mass Index (BMI) 40.0 and over, adult: Secondary | ICD-10-CM | POA: Diagnosis not present

## 2016-05-28 DIAGNOSIS — Z9884 Bariatric surgery status: Secondary | ICD-10-CM

## 2016-05-28 DIAGNOSIS — G4733 Obstructive sleep apnea (adult) (pediatric): Secondary | ICD-10-CM | POA: Diagnosis present

## 2016-05-28 HISTORY — PX: UPPER GI ENDOSCOPY: SHX6162

## 2016-05-28 HISTORY — PX: GASTRIC ROUX-EN-Y: SHX5262

## 2016-05-28 LAB — HEMOGLOBIN AND HEMATOCRIT, BLOOD
HCT: 39.2 % (ref 36.0–46.0)
HEMOGLOBIN: 13.5 g/dL (ref 12.0–15.0)

## 2016-05-28 SURGERY — LAPAROSCOPIC ROUX-EN-Y GASTRIC BYPASS WITH UPPER ENDOSCOPY
Anesthesia: General | Site: Abdomen

## 2016-05-28 MED ORDER — BUPIVACAINE-EPINEPHRINE 0.25% -1:200000 IJ SOLN
INTRAMUSCULAR | Status: AC
  Filled 2016-05-28: qty 1

## 2016-05-28 MED ORDER — FENTANYL CITRATE (PF) 100 MCG/2ML IJ SOLN
25.0000 ug | INTRAMUSCULAR | Status: DC | PRN
  Administered 2016-05-28 (×2): 25 ug via INTRAVENOUS

## 2016-05-28 MED ORDER — ROCURONIUM BROMIDE 50 MG/5ML IV SOSY
PREFILLED_SYRINGE | INTRAVENOUS | Status: DC | PRN
  Administered 2016-05-28: 50 mg via INTRAVENOUS
  Administered 2016-05-28 (×2): 20 mg via INTRAVENOUS

## 2016-05-28 MED ORDER — SODIUM CHLORIDE 0.9 % IJ SOLN
INTRAMUSCULAR | Status: AC
  Filled 2016-05-28: qty 50

## 2016-05-28 MED ORDER — FENTANYL CITRATE (PF) 100 MCG/2ML IJ SOLN
INTRAMUSCULAR | Status: AC
  Filled 2016-05-28: qty 2

## 2016-05-28 MED ORDER — PROMETHAZINE HCL 25 MG/ML IJ SOLN
12.5000 mg | Freq: Four times a day (QID) | INTRAMUSCULAR | Status: DC | PRN
Start: 2016-05-28 — End: 2016-05-30

## 2016-05-28 MED ORDER — LIDOCAINE 2% (20 MG/ML) 5 ML SYRINGE
INTRAMUSCULAR | Status: AC
  Filled 2016-05-28: qty 10

## 2016-05-28 MED ORDER — ONDANSETRON HCL 4 MG/2ML IJ SOLN: 4.0000 mg | Freq: Four times a day (QID) | INTRAMUSCULAR | Status: DC | PRN

## 2016-05-28 MED ORDER — SCOPOLAMINE 1 MG/3DAYS TD PT72
1.0000 | MEDICATED_PATCH | TRANSDERMAL | Status: DC
  Administered 2016-05-28: 1.5 mg via TRANSDERMAL
  Filled 2016-05-28: qty 1

## 2016-05-28 MED ORDER — FENTANYL CITRATE (PF) 100 MCG/2ML IJ SOLN
INTRAMUSCULAR | Status: DC | PRN
  Administered 2016-05-28: 50 ug via INTRAVENOUS
  Administered 2016-05-28: 100 ug via INTRAVENOUS
  Administered 2016-05-28 (×2): 50 ug via INTRAVENOUS

## 2016-05-28 MED ORDER — SUGAMMADEX SODIUM 200 MG/2ML IV SOLN
INTRAVENOUS | Status: DC | PRN
  Administered 2016-05-28: 400 mg via INTRAVENOUS

## 2016-05-28 MED ORDER — MIDAZOLAM HCL 5 MG/5ML IJ SOLN
INTRAMUSCULAR | Status: DC | PRN
  Administered 2016-05-28 (×2): 1 mg via INTRAVENOUS

## 2016-05-28 MED ORDER — PANTOPRAZOLE SODIUM 40 MG IV SOLR
40.0000 mg | Freq: Every day | INTRAVENOUS | Status: DC
  Administered 2016-05-28 – 2016-05-29 (×2): 40 mg via INTRAVENOUS
  Filled 2016-05-28 (×2): qty 40

## 2016-05-28 MED ORDER — FENTANYL CITRATE (PF) 100 MCG/2ML IJ SOLN
25.0000 ug | INTRAMUSCULAR | Status: DC | PRN
  Administered 2016-05-28 (×2): 25 ug via INTRAVENOUS
  Filled 2016-05-28 (×2): qty 2

## 2016-05-28 MED ORDER — GABAPENTIN 300 MG PO CAPS
300.0000 mg | ORAL_CAPSULE | ORAL | Status: AC
  Administered 2016-05-28: 300 mg via ORAL
  Filled 2016-05-28: qty 1

## 2016-05-28 MED ORDER — ONDANSETRON HCL 4 MG/2ML IJ SOLN
INTRAMUSCULAR | Status: DC | PRN
  Administered 2016-05-28: 4 mg via INTRAVENOUS

## 2016-05-28 MED ORDER — MIDAZOLAM HCL 2 MG/2ML IJ SOLN
INTRAMUSCULAR | Status: AC
Start: 2016-05-28 — End: 2016-05-28
  Filled 2016-05-28: qty 2

## 2016-05-28 MED ORDER — ENOXAPARIN SODIUM 30 MG/0.3ML ~~LOC~~ SOLN
30.0000 mg | Freq: Two times a day (BID) | SUBCUTANEOUS | Status: DC
  Administered 2016-05-29 – 2016-05-30 (×3): 30 mg via SUBCUTANEOUS
  Filled 2016-05-28 (×3): qty 0.3

## 2016-05-28 MED ORDER — EVICEL 5 ML EX KIT
PACK | Freq: Once | CUTANEOUS | Status: AC
  Administered 2016-05-28: 1
  Filled 2016-05-28: qty 1

## 2016-05-28 MED ORDER — PREMIER PROTEIN SHAKE
2.0000 [oz_av] | ORAL | Status: DC
  Administered 2016-05-30 (×4): 2 [oz_av] via ORAL

## 2016-05-28 MED ORDER — APREPITANT 40 MG PO CAPS
40.0000 mg | ORAL_CAPSULE | ORAL | Status: AC
  Administered 2016-05-28: 40 mg via ORAL
  Filled 2016-05-28: qty 1

## 2016-05-28 MED ORDER — PROPOFOL 10 MG/ML IV BOLUS
INTRAVENOUS | Status: DC | PRN
  Administered 2016-05-28: 200 mg via INTRAVENOUS

## 2016-05-28 MED ORDER — ACETAMINOPHEN 500 MG PO TABS
1000.0000 mg | ORAL_TABLET | ORAL | Status: AC
  Administered 2016-05-28: 1000 mg via ORAL
  Filled 2016-05-28: qty 2

## 2016-05-28 MED ORDER — PROPOFOL 10 MG/ML IV BOLUS
INTRAVENOUS | Status: AC
  Filled 2016-05-28: qty 20

## 2016-05-28 MED ORDER — ACETAMINOPHEN 160 MG/5ML PO SOLN: 325.0000 mg | ORAL | Status: DC | PRN

## 2016-05-28 MED ORDER — KETAMINE HCL 10 MG/ML IJ SOLN
INTRAMUSCULAR | Status: DC | PRN
  Administered 2016-05-28: 30 mg via INTRAVENOUS

## 2016-05-28 MED ORDER — ACETAMINOPHEN 325 MG PO TABS: 650.0000 mg | ORAL_TABLET | ORAL | Status: DC | PRN

## 2016-05-28 MED ORDER — CEFOTETAN DISODIUM-DEXTROSE 2-2.08 GM-% IV SOLR
2.0000 g | INTRAVENOUS | Status: AC
  Administered 2016-05-28: 2 g via INTRAVENOUS
  Filled 2016-05-28: qty 50

## 2016-05-28 MED ORDER — LIDOCAINE 2% (20 MG/ML) 5 ML SYRINGE
INTRAMUSCULAR | Status: DC | PRN
  Administered 2016-05-28: 50 mg via INTRAVENOUS

## 2016-05-28 MED ORDER — DIPHENHYDRAMINE HCL 50 MG/ML IJ SOLN
INTRAMUSCULAR | Status: DC | PRN
  Administered 2016-05-28: 12.5 mg via INTRAVENOUS

## 2016-05-28 MED ORDER — LIDOCAINE 2% (20 MG/ML) 5 ML SYRINGE
INTRAMUSCULAR | Status: AC
  Filled 2016-05-28: qty 5

## 2016-05-28 MED ORDER — STERILE WATER FOR IRRIGATION IR SOLN
Status: DC | PRN
  Administered 2016-05-28: 2000 mL

## 2016-05-28 MED ORDER — LACTATED RINGERS IV SOLN
INTRAVENOUS | Status: DC
Start: 2016-05-28 — End: 2016-05-28
  Administered 2016-05-28 (×2): via INTRAVENOUS

## 2016-05-28 MED ORDER — BUPIVACAINE LIPOSOME 1.3 % IJ SUSP
INTRAMUSCULAR | Status: DC | PRN
  Administered 2016-05-28: 20 mL

## 2016-05-28 MED ORDER — ONDANSETRON HCL 4 MG/2ML IJ SOLN
INTRAMUSCULAR | Status: AC
  Filled 2016-05-28: qty 2

## 2016-05-28 MED ORDER — BUPIVACAINE LIPOSOME 1.3 % IJ SUSP
20.0000 mL | Freq: Once | INTRAMUSCULAR | Status: DC
  Filled 2016-05-28: qty 20

## 2016-05-28 MED ORDER — DIPHENHYDRAMINE HCL 50 MG/ML IJ SOLN: 12.5000 mg | Freq: Three times a day (TID) | INTRAMUSCULAR | Status: DC | PRN

## 2016-05-28 MED ORDER — DEXAMETHASONE SODIUM PHOSPHATE 4 MG/ML IJ SOLN
4.0000 mg | INTRAMUSCULAR | Status: AC
Start: 2016-05-28 — End: 2016-05-28
  Administered 2016-05-28: 4 mg via INTRAVENOUS
  Filled 2016-05-28: qty 1

## 2016-05-28 MED ORDER — HEPARIN SODIUM (PORCINE) 5000 UNIT/ML IJ SOLN
5000.0000 [IU] | INTRAMUSCULAR | Status: AC
  Administered 2016-05-28: 5000 [IU] via SUBCUTANEOUS
  Filled 2016-05-28: qty 1

## 2016-05-28 MED ORDER — SODIUM CHLORIDE 0.9 % IJ SOLN
INTRAMUSCULAR | Status: DC | PRN
  Administered 2016-05-28: 50 mL via INTRAVENOUS

## 2016-05-28 MED ORDER — FENTANYL CITRATE (PF) 100 MCG/2ML IJ SOLN
INTRAMUSCULAR | Status: AC
  Administered 2016-05-28: 25 ug via INTRAVENOUS
  Filled 2016-05-28: qty 2

## 2016-05-28 MED ORDER — SUGAMMADEX SODIUM 500 MG/5ML IV SOLN
INTRAVENOUS | Status: AC
  Filled 2016-05-28: qty 5

## 2016-05-28 MED ORDER — PROMETHAZINE HCL 25 MG/ML IJ SOLN: 6.2500 mg | INTRAMUSCULAR | Status: DC | PRN

## 2016-05-28 MED ORDER — KCL IN DEXTROSE-NACL 20-5-0.45 MEQ/L-%-% IV SOLN
INTRAVENOUS | Status: DC
  Administered 2016-05-28: 125 mL/h via INTRAVENOUS
  Administered 2016-05-29: 1000 mL via INTRAVENOUS
  Administered 2016-05-29 (×2): via INTRAVENOUS
  Administered 2016-05-30: 125 mL/h via INTRAVENOUS
  Filled 2016-05-28 (×8): qty 1000

## 2016-05-28 MED ORDER — LIDOCAINE 2% (20 MG/ML) 5 ML SYRINGE
INTRAMUSCULAR | Status: DC | PRN
  Administered 2016-05-28: 1.5 mg/kg/h via INTRAVENOUS

## 2016-05-28 MED ORDER — OXYCODONE HCL 5 MG/5ML PO SOLN
5.0000 mg | ORAL | Status: DC | PRN
  Administered 2016-05-28 – 2016-05-29 (×6): 5 mg via ORAL
  Filled 2016-05-28 (×6): qty 5

## 2016-05-28 MED ORDER — ROCURONIUM BROMIDE 50 MG/5ML IV SOSY
PREFILLED_SYRINGE | INTRAVENOUS | Status: AC
  Filled 2016-05-28: qty 10

## 2016-05-28 MED ORDER — LACTATED RINGERS IR SOLN
Status: DC | PRN
  Administered 2016-05-28: 1000 mL

## 2016-05-28 SURGICAL SUPPLY — 81 items
ADH SKN CLS APL DERMABOND .7 (GAUZE/BANDAGES/DRESSINGS) ×2
APPLICATOR COTTON TIP 6IN STRL (MISCELLANEOUS) ×2 IMPLANT
APPLIER CLIP ROT 13.4 12 LRG (CLIP)
APR CLP LRG 13.4X12 ROT 20 MLT (CLIP)
BLADE SURG SZ11 CARB STEEL (BLADE) ×4 IMPLANT
CABLE HIGH FREQUENCY MONO STRZ (ELECTRODE) ×3 IMPLANT
CHLORAPREP W/TINT 26ML (MISCELLANEOUS) ×8 IMPLANT
CLIP APPLIE ROT 13.4 12 LRG (CLIP) IMPLANT
CLIP SUT LAPRA TY ABSORB (SUTURE) ×8 IMPLANT
COVER SURGICAL LIGHT HANDLE (MISCELLANEOUS) ×1 IMPLANT
CUTTER FLEX LINEAR 45M (STAPLE) ×4 IMPLANT
DERMABOND ADVANCED (GAUZE/BANDAGES/DRESSINGS) ×2
DERMABOND ADVANCED .7 DNX12 (GAUZE/BANDAGES/DRESSINGS) IMPLANT
DEVICE SUT QUICK LOAD TK 5 (STAPLE) IMPLANT
DEVICE SUT TI-KNOT TK 5X26 (MISCELLANEOUS) IMPLANT
DEVICE SUTURE ENDOST 10MM (ENDOMECHANICALS) ×4 IMPLANT
DEVICE TI KNOT TK5 (MISCELLANEOUS)
DRAIN PENROSE 18X1/4 LTX STRL (WOUND CARE) ×4 IMPLANT
ELECT REM PT RETURN 9FT ADLT (ELECTROSURGICAL) ×4
ELECTRODE REM PT RTRN 9FT ADLT (ELECTROSURGICAL) ×2 IMPLANT
GAUZE SPONGE 4X4 12PLY STRL (GAUZE/BANDAGES/DRESSINGS) IMPLANT
GAUZE SPONGE 4X4 16PLY XRAY LF (GAUZE/BANDAGES/DRESSINGS) ×4 IMPLANT
GLOVE BIO SURGEON STRL SZ7.5 (GLOVE) IMPLANT
GLOVE INDICATOR 8.0 STRL GRN (GLOVE) ×4 IMPLANT
GOWN STRL REUS W/TWL XL LVL3 (GOWN DISPOSABLE) ×16 IMPLANT
HOVERMATT SINGLE USE (MISCELLANEOUS) ×4 IMPLANT
IRRIG SUCT STRYKERFLOW 2 WTIP (MISCELLANEOUS)
IRRIGATION SUCT STRKRFLW 2 WTP (MISCELLANEOUS) ×1 IMPLANT
KIT BASIN OR (CUSTOM PROCEDURE TRAY) ×4 IMPLANT
KIT GASTRIC LAVAGE 34FR ADT (SET/KITS/TRAYS/PACK) ×4 IMPLANT
LUBRICANT JELLY K Y 4OZ (MISCELLANEOUS) ×4 IMPLANT
MARKER SKIN DUAL TIP RULER LAB (MISCELLANEOUS) ×4 IMPLANT
NDL SPNL 22GX3.5 QUINCKE BK (NEEDLE) ×1 IMPLANT
NEEDLE SPNL 22GX3.5 QUINCKE BK (NEEDLE) ×4 IMPLANT
PACK CARDIOVASCULAR III (CUSTOM PROCEDURE TRAY) ×4 IMPLANT
QUICK LOAD TK 5 (STAPLE)
RELOAD 45 VASCULAR/THIN (ENDOMECHANICALS) ×4 IMPLANT
RELOAD ENDO STITCH 2.0 (ENDOMECHANICALS) ×40
RELOAD STAPLE 45 2.5 WHT GRN (ENDOMECHANICALS) IMPLANT
RELOAD STAPLE 45 3.5 BLU ETS (ENDOMECHANICALS) IMPLANT
RELOAD STAPLE 60 2.6 WHT THN (STAPLE) ×2 IMPLANT
RELOAD STAPLE 60 3.6 BLU REG (STAPLE) ×2 IMPLANT
RELOAD STAPLE 60 3.8 GOLD REG (STAPLE) ×1 IMPLANT
RELOAD STAPLE TA45 3.5 REG BLU (ENDOMECHANICALS) ×8 IMPLANT
RELOAD STAPLER BLUE 60MM (STAPLE) ×4 IMPLANT
RELOAD STAPLER GOLD 60MM (STAPLE) ×2 IMPLANT
RELOAD STAPLER WHITE 60MM (STAPLE) ×2 IMPLANT
RELOAD SUT SNGL STCH BLK 2-0 (ENDOMECHANICALS) ×8 IMPLANT
SCISSORS LAP 5X45 EPIX DISP (ENDOMECHANICALS) ×4 IMPLANT
SET IRRIG TUBING LAPAROSCOPIC (IRRIGATION / IRRIGATOR) ×2 IMPLANT
SHEARS HARMONIC ACE PLUS 45CM (MISCELLANEOUS) ×4 IMPLANT
SLEEVE ADV FIXATION 12X100MM (TROCAR) ×8 IMPLANT
SLEEVE XCEL OPT CAN 5 100 (ENDOMECHANICALS) IMPLANT
SOLUTION ANTI FOG 6CC (MISCELLANEOUS) ×4 IMPLANT
STAPLER ECHELON BIOABSB 60 FLE (MISCELLANEOUS) IMPLANT
STAPLER ECHELON LONG 60 440 (INSTRUMENTS) ×4 IMPLANT
STAPLER RELOAD BLUE 60MM (STAPLE) ×8
STAPLER RELOAD GOLD 60MM (STAPLE) ×4
STAPLER RELOAD WHITE 60MM (STAPLE) ×4
SUT MNCRL AB 4-0 PS2 18 (SUTURE) ×4 IMPLANT
SUT RELOAD ENDO STITCH 2 48X1 (ENDOMECHANICALS) ×10
SUT RELOAD ENDO STITCH 2.0 (ENDOMECHANICALS) ×10
SUT SURGIDAC NAB ES-9 0 48 120 (SUTURE) IMPLANT
SUT VIC AB 2-0 SH 27 (SUTURE) ×4
SUT VIC AB 2-0 SH 27X BRD (SUTURE) ×2 IMPLANT
SUTURE RELOAD END STTCH 2 48X1 (ENDOMECHANICALS) ×10 IMPLANT
SUTURE RELOAD ENDO STITCH 2.0 (ENDOMECHANICALS) ×10 IMPLANT
SYR 10ML ECCENTRIC (SYRINGE) ×4 IMPLANT
SYR 20CC LL (SYRINGE) ×8 IMPLANT
TIP RIGID 35CM EVICEL (HEMOSTASIS) ×4 IMPLANT
TOWEL OR 17X26 10 PK STRL BLUE (TOWEL DISPOSABLE) ×4 IMPLANT
TOWEL OR NON WOVEN STRL DISP B (DISPOSABLE) ×4 IMPLANT
TRAY FOLEY W/METER SILVER 16FR (SET/KITS/TRAYS/PACK) ×4 IMPLANT
TROCAR ADV FIXATION 12X100MM (TROCAR) ×4 IMPLANT
TROCAR ADV FIXATION 5X100MM (TROCAR) ×4 IMPLANT
TROCAR BLADELESS OPT 5 100 (ENDOMECHANICALS) ×4 IMPLANT
TROCAR XCEL 12X100 BLDLESS (ENDOMECHANICALS) ×4 IMPLANT
TUBING CONNECTING 10 (TUBING) IMPLANT
TUBING CONNECTING 10' (TUBING)
TUBING ENDO SMARTCAP PENTAX (MISCELLANEOUS) ×4 IMPLANT
TUBING INSUF HEATED (TUBING) ×4 IMPLANT

## 2016-05-28 NOTE — Op Note (Signed)
Caitlin Wood [id number] [birth year]. 05/28/2016  Preoperative diagnosis:  1. Morbid obesity (BMI 48)  2. Obstructive sleep apnea  Postoperative  diagnosis:  1. same  Surgical procedure: Laparoscopic Roux-en-Y gastric bypass (ante-colic, ante-gastric); upper endoscopy  Surgeon: Atilano Ina, M.D. FACS  Asst.: Phylliss Blakes MD  Anesthesia: General plus exparel  Complications: None   EBL: Minimal   Drains: None   Disposition: PACU in good condition   Indications for procedure: 36yo female with morbid obesity who has been unsuccessful at sustained weight loss. The patient's comorbidities are listed above. We discussed the risk and benefits of surgery including but not limited to anesthesia risk, bleeding, infection, blood clot formation, anastomotic leak, anastomotic stricture, ulcer formation, death, respiratory complications, intestinal blockage, internal hernia, gallstone formation, vitamin and nutritional deficiencies, injury to surrounding structures, failure to lose weight and mood changes.   Description of procedure: Patient is brought to the operating room and general anesthesia induced. The patient had received preoperative broad-spectrum IV antibiotics and subcutaneous heparin. She had received ERAS medications. The abdomen was widely sterilely prepped with Chloraprep and draped. Patient timeout was performed and correct patient and procedure confirmed. Access was obtained with a 12 mm Optiview trocar in the left upper quadrant and pneumoperitoneum established without difficulty. Under direct vision 12 mm trocars were placed laterally in the right upper quadrant, right upper quadrant midclavicular line, and to the left and above the umbilicus for the camera port. A 5 mm trocar was placed laterally in the left upper quadrant. Exparel was infiltrated in bilateral upper abdominal walls as a TAP block.  The omentum was brought into the upper abdomen after freeing a single adhesive  band and the transverse mesocolon elevated and the ligament of Treitz clearly identified. A 40 cm biliopancreatic limb was then carefully measured from the ligament of Treitz. The small intestine was divided at this point with a single firing of the white load linear stapler. A Penrose drain was sutured to the end of the Roux-en-Y limb for later identification. A 100 cm Roux-en-Y limb was then carefully measured. At this point a side-to-side anastomosis was created between the Roux limb and the end of the biliopancreatic limb. This was accomplished with a single firing of the 45 mm white load linear stapler. The common enterotomy was closed with a running 2-0 Vicryl begun at either end of the enterotomy and tied centrally. Eviceal tissue sealant was placed over the anastomosis. The mesenteric defect was then closed with running 2-0 silk. The omentum was then divided with the harmonic scalpel up towards the transverse colon to allow mobility of the Roux limb toward the gastric pouch. The patient was then placed in steep reversed Trendelenburg. Through a 5 mm subxiphoid site the [facility] retractor was placed and the left lobe of the liver elevated with excellent exposure of the upper stomach and hiatus. The angle of Hiss was then mobilized with the harmonic scalpel. A 4 cm gastric pouch was then carefully measured along the lesser curve of the stomach. Dissection was carried along the lesser curve at this point with the Harmonic scalpel working carefully back toward the lesser sac at right angles to the lesser curve. The free lesser sac was then entered. After being sure all tubes were removed from the stomach an initial firing of the gold load 60 mm linear stapler was fired at right angles across the lesser curve for about 4 cm. The gastric pouch was further mobilized posteriorly and then the pouch was completed  with 2 further firings of the 60 mm blue load linear stapler and 1 final firing of a blue load 45mm  linear stapler up through the previously dissected angle of His. It was ensured that the pouch was completely mobilized away from the gastric remnant. This created a nice tubular 4-5 cm gastric pouch.  The Roux limb was then brought up in an antecolic fashion with the candycane facing to the patient's left without undue tension. The gastrojejunostomy was created with an initial posterior row of 2-0 Vicryl between the Roux limb and the staple line of the gastric pouch. Enterotomies were then made in the gastric pouch and the Roux limb with the harmonic scalpel and at approximately 2-2-1/2 cm anastomosis was created with a single firing of the 45mm blue load linear stapler. The staple line was inspected and was intact without bleeding. The common enterotomy was then closed with running 2-0 Vicryl begun at either end and tied centrally. The Ewall tube was then easily passed through the anastomosis and an outer anterior layer of running 2-0 Vicryl was placed. The Ewald tube was removed. With the outlet of the gastrojejunostomy clamped and under saline irrigation the assistant performed upper endoscopy and with the gastric pouch tensely distended with air-there was no evidence of leak on this test. The pouch was desufflated. The Vonita Mosseterson defect was closed with running 2-0 silk. The abdomen was inspected for any evidence of bleeding or bowel injury and everything looked fine. The Nathanson retractor was removed under direct vision after coating the anastomosis with Tisseel tissue sealant. All CO2 was evacuated and trochars removed. Skin incisions were closed with 4-0 monocryl in a subcuticular fashion followed by Dermabond. Sponge needle and instrument counts were correct. The patient was taken to the PACU in good condition.    Caitlin SellaEric M. Andrey CampanileWilson, MD, FACS General, Bariatric, & Minimally Invasive Surgery [facility], [place]

## 2016-05-28 NOTE — Anesthesia Postprocedure Evaluation (Signed)
Anesthesia Post Note  Patient: Caitlin Wood  Procedure(s) Performed: Procedure(s) (LRB): LAPAROSCOPIC ROUX-EN-Y GASTRIC BYPASS WITH UPPER ENDOSCOPY (N/A) UPPER GI ENDOSCOPY  Patient location during evaluation: PACU Anesthesia Type: General Level of consciousness: awake and alert Pain management: pain level controlled Vital Signs Assessment: post-procedure vital signs reviewed and stable Respiratory status: spontaneous breathing, nonlabored ventilation, respiratory function stable and patient connected to nasal cannula oxygen Cardiovascular status: blood pressure returned to baseline and stable Postop Assessment: no signs of nausea or vomiting Anesthetic complications: no       Last Vitals:  Vitals:   05/28/16 1428 05/28/16 1430  BP: (!) 161/86 (!) 154/91  Pulse: 90   Resp: 15 14  Temp:  37.1 C    Last Pain:  Vitals:   05/28/16 1430  TempSrc:   PainSc: 0-No pain                 Cecile HearingStephen Edward Turk

## 2016-05-28 NOTE — Transfer of Care (Signed)
Immediate Anesthesia Transfer of Care Note  Patient: Caitlin Wood  Procedure(s) Performed: Procedure(s): LAPAROSCOPIC ROUX-EN-Y GASTRIC BYPASS WITH UPPER ENDOSCOPY (N/A) UPPER GI ENDOSCOPY  Patient Location: PACU  Anesthesia Type:General  Level of Consciousness:  sedated, patient cooperative and responds to stimulation  Airway & Oxygen Therapy:Patient Spontanous Breathing and Patient connected to face mask oxgen  Post-op Assessment:  Report given to PACU RN and Post -op Vital signs reviewed and stable  Post vital signs:  Reviewed and stable  Last Vitals:  Vitals:   05/28/16 1428 05/28/16 1430  BP: (!) 161/86 (!) 154/91  Pulse: 90   Resp: 15 14  Temp:  37.1 C    Complications: No apparent anesthesia complications

## 2016-05-28 NOTE — Interval H&P Note (Signed)
History and Physical Interval Note:  05/28/2016 10:51 AM  Versie StarksLakevia D Buchinger  has presented today for surgery, with the diagnosis of MORBID OBESITY  The various methods of treatment have been discussed with the patient and family. After consideration of risks, benefits and other options for treatment, the patient has consented to  Procedure(s): LAPAROSCOPIC ROUX-EN-Y GASTRIC BYPASS WITH UPPER ENDOSCOPY (N/A) as a surgical intervention .  The patient's history has been reviewed, patient examined, no change in status, stable for surgery.  I have reviewed the patient's chart and labs.  Questions were answered to the patient's satisfaction.    Mary SellaEric M. Andrey CampanileWilson, MD, FACS General, Bariatric, & Minimally Invasive Surgery [facility], [place]  [facility] Center[facility]ILSON,Alfhild Partch M

## 2016-05-28 NOTE — Anesthesia Procedure Notes (Signed)
Procedure Name: Intubation Date/Time: 05/28/2016 11:18 AM Performed by: Deliah Boston Pre-anesthesia Checklist: Patient identified, Emergency Drugs available, Suction available and Patient being monitored Patient Re-evaluated:Patient Re-evaluated prior to inductionOxygen Delivery Method: Circle system utilized Preoxygenation: Pre-oxygenation with 100% oxygen Intubation Type: IV induction Ventilation: Mask ventilation without difficulty Laryngoscope Size: Mac and 4 Grade View: Grade II Tube type: Oral Tube size: 7.0 mm Number of attempts: 2 Airway Equipment and Method: Stylet and Oral airway Placement Confirmation: ETT inserted through vocal cords under direct vision,  positive ETCO2 and breath sounds checked- equal and bilateral Secured at: 21 cm Tube secured with: Tape Dental Injury: Teeth and Oropharynx as per pre-operative assessment

## 2016-05-28 NOTE — Progress Notes (Signed)
CPAP rolled in patient room for oncoming shift. Patient has brought home mask. Home settings are CPAP 8 per patient.

## 2016-05-28 NOTE — Op Note (Signed)
Preoperative diagnosis: Roux-en-Y gastric bypass  Postoperative diagnosis: Same   Procedure: Upper endoscopy   Surgeon: Berna Buehelsea A Maryelizabeth Eberle, M.D.  Anesthesia: Gen.   Indications for procedure: This patient was undergoing a Roux-en-Y gastric bypass.   Description of procedure: The endoscopy was placed in the mouth and into the oropharynx and under endoscopic vision it was advanced to the esophagogastric junction. The pouch was insufflated and no bleeding or bubbles were seen. The GEJ was identified at 38cm from the teeth. The gastrojejunostomy was patent. No bleeding or leaks were detected. The scope was withdrawn without difficulty.   Berna Buehelsea A Japji Kok, M.D. General, Bariatric, & Minimally Invasive Surgery [facility] [place]

## 2016-05-28 NOTE — Anesthesia Preprocedure Evaluation (Addendum)
Anesthesia Evaluation  Patient identified by MRN, date of birth, ID band Patient awake    Reviewed: Allergy & Precautions, NPO status , Patient's Chart, lab work & pertinent test results  Airway Mallampati: I  TM Distance: >3 FB Neck ROM: Full    Dental  (+) Teeth Intact, Dental Advisory Given   Pulmonary neg pulmonary ROS,    Pulmonary exam normal breath sounds clear to auscultation       Cardiovascular negative cardio ROS Normal cardiovascular exam Rhythm:Regular Rate:Normal     Neuro/Psych negative neurological ROS  negative psych ROS   GI/Hepatic negative GI ROS, Neg liver ROS,   Endo/Other  Morbid obesity  Renal/GU negative Renal ROS     Musculoskeletal negative musculoskeletal ROS (+)   Abdominal   Peds  Hematology negative hematology ROS (+)   Anesthesia Other Findings Day of surgery medications reviewed with the patient.  Reproductive/Obstetrics                            Anesthesia Physical Anesthesia Plan  ASA: III  Anesthesia Plan: General   Post-op Pain Management:    Induction: Intravenous  Airway Management Planned: Oral ETT  Additional Equipment:   Intra-op Plan:   Post-operative Plan: Extubation in OR  Informed Consent: I have reviewed the patients History and Physical, chart, labs and discussed the procedure including the risks, benefits and alternatives for the proposed anesthesia with the patient or authorized representative who has indicated his/her understanding and acceptance.   Dental advisory given  Plan Discussed with: CRNA  Anesthesia Plan Comments: (Risks/benefits of general anesthesia discussed with patient including risk of damage to teeth, lips, gum, and tongue, nausea/vomiting, allergic reactions to medications, and the possibility of heart attack, stroke and death.  All patient questions answered.  Patient wishes to proceed.  ERAS protocol.)         Anesthesia Quick Evaluation

## 2016-05-28 NOTE — H&P (View-Only) (Signed)
Caitlin Wood 05/17/2016 10:03 AM Location: [facility] Patient #: [id number] DOB: 03/09/1981 Married / Language: English / Race: Black or African American Female  History of Present Illness (Leonid Manus M. Lilyth Lawyer MD; 05/17/2016 1:12 PM) The patient is a 35 year old female who presents for a pre-op visit. She comes in today for her preoperative visit. I initially met her in June 2017. Her initial visit weight was 270 pounds. She has been approved for laparoscopic Roux-en-Y gastric bypass. She denies any medical changes since I initially met her. She denies any trips the emergency room or hospital. She denies any reflux, chest pain, chest pressure, source of breath, orthopnea, dyspnea on exertion, paroxysmal nocturnal dyspnea, melena or hematochezia. She denies any dysuria or hematuria. She denies any vision changes.  Her upper GI was within normal limits. Her bariatric evaluation labs in October 2017 were unremarkable. No anemia. no vitamin deficiencies. Normal lipid panel.  She had a history of a positive sleep study but had not been set up on CPAP when I initially saw her. She has been on CPAP for about a week. She reports more energy during the daytime and a better night's rest.  Review systems 12 point review systems was performed and all systems are negative except for what is mentioned in HPI   Problem List/Past Medical (Yul Diana M. Mackey Varricchio, MD; 05/17/2016 1:14 PM) MORBID OBESITY WITH BMI OF 45.0-49.9, ADULT (E66.01) OSA (OBSTRUCTIVE SLEEP APNEA) (G47.33)  Allergies (Maty Zeisler M. Elieser Tetrick, MD; 05/17/2016 1:14 PM) Codeine Sulfate *ANALGESICS - OPIOID* Nausea, Vomiting. No Known Drug Allergies 09/08/2015  Medication History (Simi Briel M. Yaniris Braddock, MD; 05/17/2016 1:14 PM) ValACYclovir HCl (500MG Tablet, Oral) Active. No Current Medications (Taken starting 05/17/2016) Medications Reconciled Protonix (40MG Tablet DR, 1 (one) Tablet Oral daily, Taken starting 05/17/2016) Active. Zofran  ODT (4MG Tablet Disint, 1 (one) Tablet Oral every six hours, as needed, Taken starting 05/17/2016) Active.    Vitals (Emily Schmitz CMA; 05/17/2016 10:09 AM) 05/17/2016 10:06 AM Weight: 283.6 lb Height: 64in Body Surface Area: 2.27 m Body Mass Index: 48.68 kg/m  Temp.: 98.3F  Pulse: 66 (Regular)  BP: 124/70 (Sitting, Left Arm, Large)      Physical Exam (Tage Feggins M. Antara Brecheisen MD; 05/17/2016 1:10 PM)  General Mental Status-Alert. General Appearance-Consistent with stated age. Hydration-Well hydrated. Voice-Normal.  Head and Neck Head-normocephalic, atraumatic with no lesions or palpable masses. Trachea-midline. Thyroid Gland Characteristics - normal size and consistency.  Eye Eyeball - Bilateral-Normal. Sclera/Conjunctiva - Bilateral-No scleral icterus.  Chest and Lung Exam Chest and lung exam reveals -quiet, even and easy respiratory effort with no use of accessory muscles and on auscultation, normal breath sounds, no adventitious sounds and normal vocal resonance. Inspection Chest Wall - Normal. Back - normal.  Breast - Did not examine.  Cardiovascular Cardiovascular examination reveals -normal heart sounds, regular rate and rhythm with no murmurs and normal pedal pulses bilaterally.  Abdomen Inspection Inspection of the abdomen reveals - No Hernias. Palpation/Percussion Palpation and Percussion of the abdomen reveal - Soft, Non Tender, No Rebound tenderness, No Rigidity (guarding) and No hepatosplenomegaly. Auscultation Auscultation of the abdomen reveals - Bowel sounds normal.  Peripheral Vascular Upper Extremity Palpation - Pulses bilaterally normal.  Neurologic Neurologic evaluation reveals -alert and oriented x 3 with no impairment of recent or remote memory. Mental Status-Normal.  Neuropsychiatric The patient's mood and affect are described as -normal. Judgment and Insight-insight is appropriate concerning matters  relevant to self.  Musculoskeletal Normal Exam - Left-Upper Extremity Strength Normal and Lower Extremity   Strength Normal. Normal Exam - Right-Upper Extremity Strength Normal and Lower Extremity Strength Normal.  Lymphatic Head & Neck  General Head & Neck Lymphatics: Bilateral - Description - Normal. Axillary - Did not examine. Femoral & Inguinal - Did not examine.    Assessment & Plan Randall Hiss M. Jamilia Jacques MD; 05/17/2016 1:13 PM)  MORBID OBESITY WITH BMI OF 45.0-49.9, ADULT (E66.01) Impression: We reviewed her preoperative workup. We discussed the results of her imaging studies and labs. We rediscussed the typical hospital course as well as the typical recovery period we rediscussed proper eating techniques after surgery. She states that she has had trouble with tolerating pain medicine in the past. Therefore we will not give her postoperative pain prescription today. We will see how she does the hospital with tolerating oxycodone. We discussed the importance of the preoperative meal plan. We discussed the importance of doing the bowel prep the day before surgery. All of her questions were asked and answered.  Current Plans Pt Education - EMW_preopbariatric OSA (OBSTRUCTIVE SLEEP APNEA) (G47.33) Impression: cont CPAP  Leighton Ruff. Redmond Pulling, MD, FACS General, Bariatric, & Minimally Invasive Surgery [facility], [place]

## 2016-05-29 ENCOUNTER — Encounter (HOSPITAL_COMMUNITY): Payer: Self-pay | Admitting: General Surgery

## 2016-05-29 ENCOUNTER — Ambulatory Visit: Payer: Self-pay

## 2016-05-29 LAB — COMPREHENSIVE METABOLIC PANEL
ALBUMIN: 3.7 g/dL (ref 3.5–5.0)
ALT: 27 U/L (ref 14–54)
AST: 30 U/L (ref 15–41)
Alkaline Phosphatase: 59 U/L (ref 38–126)
Anion gap: 8 (ref 5–15)
BILIRUBIN TOTAL: 0.7 mg/dL (ref 0.3–1.2)
BUN: 7 mg/dL (ref 6–20)
CO2: 25 mmol/L (ref 22–32)
CREATININE: 0.95 mg/dL (ref 0.44–1.00)
Calcium: 9.1 mg/dL (ref 8.9–10.3)
Chloride: 105 mmol/L (ref 101–111)
GFR calc Af Amer: >60 mL/min (ref >60)
GFR calc non Af Amer: >60 mL/min (ref >60)
GLUCOSE: 127 mg/dL — AB (ref 65–99)
POTASSIUM: 3.7 mmol/L (ref 3.5–5.1)
Sodium: 138 mmol/L (ref 135–145)
TOTAL PROTEIN: 8.5 g/dL — AB (ref 6.5–8.1)

## 2016-05-29 LAB — CBC WITH DIFFERENTIAL/PLATELET
Basophils Absolute: 0 10*3/uL (ref 0.0–0.1)
Basophils Relative: 0 %
Eosinophils Absolute: 0 10*3/uL (ref 0.0–0.7)
Eosinophils Relative: 0 %
HCT: 36.8 % (ref 36.0–46.0)
Hemoglobin: 12.1 g/dL (ref 12.0–15.0)
Lymphocytes Relative: 11 %
Lymphs Abs: 2.3 10*3/uL (ref 0.7–4.0)
MCH: 28.3 pg (ref 26.0–34.0)
MCHC: 32.9 g/dL (ref 30.0–36.0)
MCV: 86 fL (ref 78.0–100.0)
Monocytes Absolute: 2 10*3/uL — ABNORMAL HIGH (ref 0.1–1.0)
Monocytes Relative: 9 %
Neutro Abs: 16.6 10*3/uL — ABNORMAL HIGH (ref 1.7–7.7)
Neutrophils Relative %: 80 %
Platelets: 353 10*3/uL (ref 150–400)
RBC: 4.28 MIL/uL (ref 3.87–5.11)
RDW: 13.1 % (ref 11.5–15.5)
WBC: 20.8 10*3/uL — ABNORMAL HIGH (ref 4.0–10.5)

## 2016-05-29 LAB — CBC
HCT: 35.1 % — ABNORMAL LOW (ref 36.0–46.0)
Hemoglobin: 12.2 g/dL (ref 12.0–15.0)
MCH: 29.5 pg (ref 26.0–34.0)
MCHC: 34.8 g/dL (ref 30.0–36.0)
MCV: 85 fL (ref 78.0–100.0)
Platelets: 311 10*3/uL (ref 150–400)
RBC: 4.13 MIL/uL (ref 3.87–5.11)
RDW: 13.3 % (ref 11.5–15.5)
WBC: 18.7 10*3/uL — ABNORMAL HIGH (ref 4.0–10.5)

## 2016-05-29 MED ORDER — SIMETHICONE 40 MG/0.6ML PO SUSP
40.0000 mg | Freq: Four times a day (QID) | ORAL | Status: DC | PRN
  Administered 2016-05-29: 40 mg via ORAL
  Filled 2016-05-29 (×2): qty 0.6

## 2016-05-29 NOTE — Progress Notes (Signed)
Patient ID: Caitlin Wood, female   DOB: 03/01/1981, 36 y.o.   MRN: [id number]  Progress Note: Metabolic and Bariatric Surgery Service   Subjective: Doing well. Some gas pain. No nausea. Tolerated water. Ambulated multiple times. Gas pain is better  Objective: Vital signs in last 24 hours: Temp:  [98.3 F (36.8 C)-98.7 F (37.1 C)] 98.4 F (36.9 C) (03/13 1000) Pulse Rate:  [61-90] 72 (03/13 1000) Resp:  [12-18] 18 (03/13 1000) BP: (122-162)/(64-102) 130/88 (03/13 1111) SpO2:  [97 %-100 %] 99 % (03/13 1000) Last BM Date: 05/28/16  Intake/Output from previous day: 03/12 0701 - 03/13 0700 In: 3786.3 [P.O.:480; I.V.:3306.3] Out: 2580 [Urine:2580] Intake/Output this shift: Total I/O In: 325 [P.O.:200; I.V.:125] Out: 300 [Urine:300]  Lungs: cta  Cardiovascular: reg  Abd: soft, obese, mild approp ttp, incisions c/d/i  Extremities: no edema, scd  Neuro: approp, nonfocal, not ill appearing  Lab Results: CBC   Recent Labs  05/28/16 1633 05/29/16 0703  WBC  --  20.8*  HGB 13.5 12.1  HCT 39.2 36.8  PLT  --  353   BMET  Recent Labs  05/29/16 0703  NA 138  K 3.7  CL 105  CO2 25  GLUCOSE 127*  BUN 7  CREATININE 0.95  CALCIUM 9.1   PT/INR No results for input(s): LABPROT, INR in the last 72 hours. ABG No results for input(s): PHART, HCO3 in the last 72 hours.  Invalid input(s): PCO2, PO2  Studies/Results:  Anti-infectives: Anti-infectives    Start     Dose/Rate Route Frequency Ordered Stop   05/28/16 0824  cefoTEtan in Dextrose 5% (CEFOTAN) IVPB 2 g     2 g Intravenous On call to O.R. 05/28/16 0824 05/28/16 1123      Medications: Scheduled Meds: . enoxaparin (LOVENOX) injection  30 mg Subcutaneous Q12H  . pantoprazole (PROTONIX) IV  40 mg Intravenous QHS  . [START ON 05/30/2016] protein supplement shake  2 oz Oral Q2H   Continuous Infusions: . dextrose 5 % and 0.45 % NaCl with KCl 20 mEq/L 125 mL/hr at 05/29/16 0935   PRN Meds:.oxyCODONE  **AND** acetaminophen, acetaminophen, diphenhydrAMINE, fentaNYL (SUBLIMAZE) injection, ondansetron (ZOFRAN) IV, promethazine, simethicone  Assessment/Plan: Patient Active Problem List   Diagnosis Date Noted  . S/P gastric bypass 05/28/2016  . Lumbar back pain 10/09/2013   s/p Procedure(s): LAPAROSCOPIC ROUX-EN-Y GASTRIC BYPASS WITH UPPER ENDOSCOPY UPPER GI ENDOSCOPY 05/28/2016  Doing well. No fever. No tachycardia, bump in wbc. I think it is stress response. Will follow trend.  Shakes/water as tolerated Ambulated Cont chemical vte prophylaxis Disposition:  LOS: 1 day  The patient will be in the hospital for normal postop protocol  Atilano InaWILSON,Deacon Gadbois M, MD [phone] [facility]

## 2016-05-29 NOTE — Plan of Care (Signed)
Problem: Food- and Nutrition-Related Knowledge Deficit (NB-1.1) Goal: Nutrition education Formal process to instruct or train a patient/client in a skill or to impart knowledge to help patients/clients voluntarily manage or modify food choices and eating behavior to maintain or improve health. Outcome: Completed/Met Date Met: 05/29/16 Nutrition Education Note  Received consult for diet education per DROP protocol.   Discussed 2 week post op diet with pt. Emphasized that liquids must be non carbonated, non caffeinated, and sugar free. Fluid goals discussed. Pt to follow up with outpatient bariatric RD for further diet progression after 2 weeks. Multivitamins and minerals also reviewed. Teach back method used, pt expressed understanding, expect good compliance.   Diet: First 2 Weeks  You will see the nutritionist about two (2) weeks after your surgery. The nutritionist will increase the types of foods you can eat if you are handling liquids well:  If you have severe vomiting or nausea and cannot handle clear liquids lasting longer than 1 day, call your surgeon  Protein Shake  Drink at least 2 ounces of shake 5-6 times per day  Each serving of protein shakes (usually 8 - 12 ounces) should have a minimum of:  15 grams of protein  And no more than 5 grams of carbohydrate  Goal for protein each day:  Men = 80 grams per day  Women = 60 grams per day  Protein powder may be added to fluids such as non-fat milk or Lactaid milk or Soy milk (limit to 35 grams added protein powder per serving)   Hydration  Slowly increase the amount of water and other clear liquids as tolerated (See Acceptable Fluids)  Slowly increase the amount of protein shake as tolerated  Sip fluids slowly and throughout the day  May use sugar substitutes in small amounts (no more than 6 - 8 packets per day; i.e. Splenda)   Fluid Goal  The first goal is to drink at least 8 ounces of protein shake/drink per day (or as directed  by the nutritionist); some examples of protein shakes are Premier Protein, Syntrax Nectar, Adkins Advantage, EAS Edge HP, and Unjury. See handout from pre-op Bariatric Education Class:  Slowly increase the amount of protein shake you drink as tolerated  You may find it easier to slowly sip shakes throughout the day  It is important to get your proteins in first  Your fluid goal is to drink 64 - 100 ounces of fluid daily  It may take a few weeks to build up to this  32 oz (or more) should be clear liquids  And  32 oz (or more) should be full liquids (see below for examples)  Liquids should not contain sugar, caffeine, or carbonation   Clear Liquids:  Water or Sugar-free flavored water (i.e. Fruit H2O, Propel)  Decaffeinated coffee or tea (sugar-free)  Crystal Lite, Wyler's Lite, Minute Maid Lite  Sugar-free Jell-O  Bouillon or broth  Sugar-free Popsicle: *Less than 20 calories each; Limit 1 per day   Full Liquids:  Protein Shakes/Drinks + 2 choices per day of other full liquids  Full liquids must be:  No More Than 12 grams of Carbs per serving  No More Than 3 grams of Fat per serving  Strained low-fat cream soup  Non-Fat milk  Fat-free Lactaid Milk  Sugar-free yogurt (Dannon Lite & Fit, Greek yogurt, Oikos Zero)   Bexley Mclester, MS, RD, LDN Pager: [phone] After Hours Pager: [phone]     

## 2016-05-29 NOTE — Progress Notes (Signed)
Patient alert and oriented, Post op day 1.  Provided support and encouragement.  Encouraged pulmonary toilet, ambulation and small sips of liquids.  All questions answered.  Will continue to monitor. 

## 2016-05-29 NOTE — Procedures (Signed)
CPAP for beside and ready for use.  Patient stated she will place on herself when ready for bed and does not need any assistance.

## 2016-05-30 LAB — CBC WITH DIFFERENTIAL/PLATELET
BASOS ABS: 0.1 10*3/uL (ref 0.0–0.1)
Basophils Relative: 0 %
Eosinophils Absolute: 0.1 10*3/uL (ref 0.0–0.7)
Eosinophils Relative: 0 %
HEMATOCRIT: 35.1 % — AB (ref 36.0–46.0)
HEMOGLOBIN: 11.7 g/dL — AB (ref 12.0–15.0)
LYMPHS PCT: 22 %
Lymphs Abs: 3.6 10*3/uL (ref 0.7–4.0)
MCH: 29 pg (ref 26.0–34.0)
MCHC: 33.3 g/dL (ref 30.0–36.0)
MCV: 86.9 fL (ref 78.0–100.0)
MONO ABS: 2 10*3/uL — AB (ref 0.1–1.0)
MONOS PCT: 12 %
NEUTROS ABS: 10.5 10*3/uL — AB (ref 1.7–7.7)
Neutrophils Relative %: 65 %
Platelets: 292 10*3/uL (ref 150–400)
RBC: 4.04 MIL/uL (ref 3.87–5.11)
RDW: 13.3 % (ref 11.5–15.5)
WBC: 16.2 10*3/uL — ABNORMAL HIGH (ref 4.0–10.5)

## 2016-05-30 MED ORDER — OXYCODONE HCL 5 MG/5ML PO SOLN: 5.0000 mg | ORAL | 0 refills | Status: DC | PRN

## 2016-05-30 NOTE — Progress Notes (Signed)
Pt was discharged home today. Instructions were reviewed with patient, prescriptions were given  and questions were answered. Pt was taken to main entrance via wheelchair by NT.  

## 2016-05-30 NOTE — Discharge Summary (Signed)
Physician Discharge Summary  GILDA ABBOUD RUE:[id number] DOB: [birth year] DOA: 05/28/2016  PCP: Toma Deiters, MD  Admit date: 05/28/2016 Discharge date: 05/30/2016  Recommendations for Outpatient Follow-up:    Follow-up Information    Atilano Ina, MD Follow up on 06/27/2016.   Specialty:  General Surgery Why:  @ [address] information: [address] [place] [address] [phone]        Atilano Ina, MD Follow up.   Specialty:  General Surgery Contact information: [address] [place] [address] [phone]          Discharge Diagnoses:  Active Problems:   S/P gastric bypass morbid obesity OSA  Surgical Procedure: Laparoscopic Roux-en-Y gastric bypass, upper endoscopy  Discharge Condition: Good Disposition: Home  Diet recommendation: Postoperative gastric bypass diet  Filed Weights   05/28/16 0851 05/29/16 2036  Weight: 126.7 kg (279 lb 5 oz) 127.9 kg (281 lb 14.4 oz)     Hospital Course:  The patient was admitted for a planned laparoscopic Roux-en-Y gastric bypass. Please see operative note. Preoperatively the patient was given 5000 units of subcutaneous heparin for DVT prophylaxis. She was started on water/ice about 6hrs after surgery.  Postoperative prophylactic Lovenox dosing was started on the morning of postoperative day 1. Her wbc did bump some but she remained afebrile, and not tachycardic. She was advanced to protein shakes late POD 1.  On postoperative day 2 The patient's diet was continued which they also tolerated. The patient was ambulating without difficulty. Their vital signs are stable without fever or tachycardia. Their hemoglobin had remained stable. The patient was maintained on their home settings for CPAP therapy. The patient had received discharge instructions and counseling. They were deemed stable for discharge.  BP 130/82 (BP Location: Right Arm)   Pulse 65   Temp 98.9 F (37.2 C) (Oral)   Resp 18    Ht 5\' 4"  (1.626 m)   Wt 127.9 kg (281 lb 14.4 oz)   SpO2 100%   BMI 48.39 kg/m   Gen: alert, NAD, non-toxic appearing Pupils: equal, no scleral icterus Pulm: Lungs clear to auscultation, symmetric chest rise CV: regular rate and rhythm Abd: soft, very min tender, nondistended. no cellulitis. No incisional hernia Ext: no edema, no calf tenderness Skin: no rash, no jaundice   Discharge Instructions  Discharge Instructions    Ambulate hourly while awake    Complete by:  As directed    Call MD for:  difficulty breathing, headache or visual disturbances    Complete by:  As directed    Call MD for:  persistant dizziness or light-headedness    Complete by:  As directed    Call MD for:  persistant nausea and vomiting    Complete by:  As directed    Call MD for:  redness, tenderness, or signs of infection (pain, swelling, redness, odor or green/yellow discharge around incision site)    Complete by:  As directed    Call MD for:  severe uncontrolled pain    Complete by:  As directed    Call MD for:  temperature >101 F    Complete by:  As directed    Diet bariatric full liquid    Complete by:  As directed    Discharge instructions    Complete by:  As directed    See bariatric discharge instructions   Incentive spirometry    Complete by:  As directed    Perform hourly while awake  Allergies as of 05/30/2016      Reactions   Codeine Nausea And Vomiting      Medication List    TAKE these medications   acetaminophen 500 MG tablet Commonly known as:  TYLENOL Take 1,000 mg by mouth every 6 (six) hours as needed for mild pain or moderate pain.   oxyCODONE 5 MG/5ML solution Commonly known as:  ROXICODONE Take 5-10 mLs (5-10 mg total) by mouth every 4 (four) hours as needed for moderate pain or severe pain.   valACYclovir 500 MG tablet Commonly known as:  VALTREX Take 500 mg by mouth daily as needed (outbreaks).      Follow-up Information    Atilano InaWILSON,Ranyah Groeneveld M, MD Follow up  on 06/27/2016.   Specialty:  General Surgery Why:  @ [address] Contact information: [address] [place] [address] [phone]        Atilano InaWILSON,Renesha Lizama M, MD Follow up.   Specialty:  General Surgery Contact information: [address] [place] [address] [phone]            The results of significant diagnostics from this hospitalization (including imaging, microbiology, ancillary and laboratory) are listed below for reference.    Significant Diagnostic Studies: No results found.  Labs: Basic Metabolic Panel:  Recent Labs Lab 05/25/16 1400 05/29/16 0703  NA 139 138  K 4.7 3.7  CL 105 105  CO2 28 25  GLUCOSE 97 127*  BUN 15 7  CREATININE 0.89 0.95  CALCIUM 9.5 9.1   Liver Function Tests:  Recent Labs Lab 05/25/16 1400 05/29/16 0703  AST 22 30  ALT 16 27  ALKPHOS 68 59  BILITOT 0.8 0.7  PROT 8.0 8.5*  ALBUMIN 4.1 3.7    CBC:  Recent Labs Lab 05/25/16 1400 05/28/16 1633 05/29/16 0703 05/29/16 1554 05/30/16 0828  WBC 11.5*  --  20.8* 18.7* 16.2*  NEUTROABS 6.9  --  16.6*  --  10.5*  HGB 12.9 13.5 12.1 12.2 11.7*  HCT 39.6 39.2 36.8 35.1* 35.1*  MCV 86.8  --  86.0 85.0 86.9  PLT 347  --  353 311 292    CBG: No results for input(s): GLUCAP in the last 168 hours.  Active Problems:   S/P gastric bypass   Time coordinating discharge: 15 min  Signed:  Atilano InaEric M Aleera Gilcrease, MD [facility], [place] [phone] 05/30/2016, 12:26 PM

## 2016-05-30 NOTE — Progress Notes (Signed)
2 Days Post-Op  Subjective: Not much gas pain; having some cramping. Walked a lot. Took in shake/water  Objective: Vital signs in last 24 hours: Temp:  [97.7 F (36.5 C)-98.9 F (37.2 C)] 98.9 F (37.2 C) (03/14 0457) Pulse Rate:  [57-72] 61 (03/14 0457) Resp:  [17-18] 18 (03/14 0457) BP: (117-140)/(62-102) 134/76 (03/14 0457) SpO2:  [96 %-100 %] 96 % (03/14 0457) Weight:  [127.9 kg (281 lb 14.4 oz)] 127.9 kg (281 lb 14.4 oz) (03/13 2036) Last BM Date: 05/28/16  Intake/Output from previous day: 03/13 0701 - 03/14 0700 In: 3054.2 [P.O.:500; I.V.:2554.2] Out: 2150 [Urine:2150] Intake/Output this shift: Total I/O In: 60 [P.O.:60] Out: -   Alert, nad cta Reg Soft, obese, incisions c/d/i, min TTP No edema  Lab Results:   Recent Labs  05/29/16 0703 05/29/16 1554  WBC 20.8* 18.7*  HGB 12.1 12.2  HCT 36.8 35.1*  PLT 353 311   BMET  Recent Labs  05/29/16 0703  NA 138  K 3.7  CL 105  CO2 25  GLUCOSE 127*  BUN 7  CREATININE 0.95  CALCIUM 9.1   PT/INR No results for input(s): LABPROT, INR in the last 72 hours. ABG No results for input(s): PHART, HCO3 in the last 72 hours.  Invalid input(s): PCO2, PO2  Studies/Results: No results found.  Anti-infectives: Anti-infectives    Start     Dose/Rate Route Frequency Ordered Stop   05/28/16 0824  cefoTEtan in Dextrose 5% (CEFOTAN) IVPB 2 g     2 g Intravenous On call to O.R. 05/28/16 0824 05/28/16 1123      Assessment/Plan: s/p Procedure(s): LAPAROSCOPIC ROUX-EN-Y GASTRIC BYPASS WITH UPPER ENDOSCOPY (N/A) UPPER GI ENDOSCOPY  Wbc still a little elevated last pm, this am - having difficulty drawing but no fever, no tachycardia, no signif abd pain Cont diet Probable dc later this am  Dc teaching Discussed dc instructions  Mary Sellaric M. Andrey CampanileWilson, MD, FACS General, Bariatric, & Minimally Invasive Surgery [facility], [place]   LOS: 2 days    Atilano InaWILSON,Taran Haynesworth M 05/30/2016

## 2016-05-30 NOTE — Discharge Instructions (Signed)

## 2016-05-30 NOTE — Progress Notes (Signed)
Patient alert and oriented, pain is controlled. Patient is tolerating fluids, advanced to protein shake today, patient is tolerating well. Reviewed Gastric Bypass discharge instructions with patient and patient is able to articulate understanding. Provided information on BELT program, Support Group and [facility] outpatient pharmacy. All questions answered, will continue to monitor.    

## 2016-05-30 NOTE — Progress Notes (Signed)
Patient alert and oriented, Post op day 2.  Provided support and encouragement.  Encouraged pulmonary toilet, ambulation and small sips of liquids.  All questions answered.  Will continue to monitor. 

## 2016-06-05 ENCOUNTER — Telehealth (HOSPITAL_COMMUNITY): Payer: Self-pay

## 2016-06-05 NOTE — Telephone Encounter (Signed)
Made discharge phone call to patient. Asking the following questions.    1. Do you have someone to care for you now that you are home?  independent 2. Are you having pain now that is not relieved by your pain medication?  Doesn't need pain medication 3. Are you able to drink the recommended daily amount of fluids (48 ounces minimum/day) and protein (60-80 grams/day) as prescribed by the dietitian or nutritional counselor?  45 grams of protein and 64 ounces of fluid 4. Are you taking the vitamins and minerals as prescribed?  Sounds like no problems with bariactiv prescribed by dr Andrey CampanileWilson 5. Do you have the "on call" number to contact your surgeon if you have a problem or question?  ye 6. Are your incisions free of redness, swelling or drainage? (If steri strips, address that these can fall off, shower as tolerated)no problems 7. Have your bowels moved since your surgery?  If not, are you passing gas?  yes 8. Are you up and walking 3-4 times per day?  yes 9. Were you provided your discharge medications before your surgery or before you were discharged from the hospital and are you taking them without problem?  Yes had meds no problems with medications

## 2016-06-06 ENCOUNTER — Ambulatory Visit: Payer: Self-pay | Admitting: Psychiatry

## 2016-06-12 ENCOUNTER — Encounter: Payer: BLUE CROSS/BLUE SHIELD | Attending: General Surgery | Admitting: Skilled Nursing Facility1

## 2016-06-12 DIAGNOSIS — E669 Obesity, unspecified: Secondary | ICD-10-CM

## 2016-06-12 DIAGNOSIS — Z713 Dietary counseling and surveillance: Secondary | ICD-10-CM | POA: Diagnosis present

## 2016-06-14 ENCOUNTER — Encounter: Payer: Self-pay | Admitting: Skilled Nursing Facility1

## 2016-06-14 NOTE — Progress Notes (Signed)
Bariatric Class:  Appt start time: 1530 end time:  1630.  2 Week Post-Operative Nutrition Class  Patient was seen on 06/12/2016 for Post-Operative Nutrition education at [facility].   Surgery date: 05/28/2016 Surgery type: RYGB Start weight at [facility]: 274.5 Weight today: 281.2 Weight change: 6.7 gain  TANITA  BODY COMP RESULTS  06/12/2016   BMI (kg/m^2) 45.8   Fat Mass (lbs) 143.2   Fat Free Mass (lbs) 123.4   Total Body Water (lbs) 91.8   The following the learning objectives were met by the patient during this course:  Identifies Phase 3A (Soft, High Proteins) Dietary Goals and will begin from 2 weeks post-operatively to 2 months post-operatively  Identifies appropriate sources of fluids and proteins   States protein recommendations and appropriate sources post-operatively  Identifies the need for appropriate texture modifications, mastication, and bite sizes when consuming solids  Identifies appropriate multivitamin and calcium sources post-operatively  Describes the need for physical activity post-operatively and will follow MD recommendations  States when to call healthcare provider regarding medication questions or post-operative complications  Handouts given during class include:  Phase 3A: Soft, High Protein Diet Handout  Follow-Up Plan: Patient will follow-up at [facility] in 6 weeks for 2 month post-op nutrition visit for diet advancement per MD.

## 2016-06-19 ENCOUNTER — Ambulatory Visit: Payer: Self-pay | Admitting: General Surgery

## 2016-07-04 ENCOUNTER — Telehealth: Payer: Self-pay

## 2016-07-04 NOTE — Telephone Encounter (Signed)
I called pt and left a message on her cell phone number (per DPR) reminding her to bring her cpap to her appt tomorrow with Dr. Frances Furbish and to call back with any questions or concerns.

## 2016-07-05 ENCOUNTER — Ambulatory Visit: Payer: Self-pay | Admitting: Neurology

## 2016-07-24 ENCOUNTER — Encounter: Payer: Self-pay | Admitting: Neurology

## 2016-07-24 ENCOUNTER — Telehealth: Payer: Self-pay

## 2016-07-24 NOTE — Telephone Encounter (Signed)
LM for patient to bring in her CPAP or memory card from CPAP to appt tomorrow.

## 2016-07-25 ENCOUNTER — Ambulatory Visit (INDEPENDENT_AMBULATORY_CARE_PROVIDER_SITE_OTHER): Payer: BLUE CROSS/BLUE SHIELD | Admitting: Neurology

## 2016-07-25 ENCOUNTER — Encounter: Payer: Self-pay | Admitting: Neurology

## 2016-07-25 VITALS — BP 116/68 | HR 70 | Ht 64.0 in | Wt 250.0 lb

## 2016-07-25 DIAGNOSIS — G4733 Obstructive sleep apnea (adult) (pediatric): Secondary | ICD-10-CM

## 2016-07-25 NOTE — Progress Notes (Signed)
Subjective:    Patient ID: Caitlin Wood is a 36 y.o. female.  HPI     Interim history:   Caitlin Wood is a 36 year old right-handed woman with an underlying medical history there of low back pain and obesity who presents for follow-up consultation of her obstructive sleep apnea, on CPAP therapy, with status post recent gastric bypass surgery in March 2018. The patient is unaccompanied today. I first met her on 09/29/2015, at which time we talked about her split-night sleep study results from 12/10/2013. She was placed on CPAP therapy.   Today, 07/25/2016: I reviewed her CPAP compliance data from 06/25/2016 through 07/24/2016 which is a total of 30 days, during which time she used her machine only 8 days with percent used days greater than 4 hours at 20%, indicating poor compliance. Average usage of 5 hours and 38 minutes for days on treatment. AHI 0 per hour, leak acceptable with the 95th percentile at 12.8 L/m on a pressure of 8 cm. In the past 90 days, she had 33% compliance. She reports Having been unable to use her CPAP recently because of having tight braids in the headgear interfered with them. She had gastric bypass surgery on 05/28/2016. She was seen for her 2 week postop nutrition visit on 06/12/2016, and I reviewed the note, she had a little bit of weight gain from her preop weight. She reports feeling better after surgery, has lost about 35 lb since her max weight. She had braids until recently, could not wear headgear without discomfort. Willing to go back on treatment more consistently, reports cough recently, 37 yo son had URI.   The patient's allergies, current medications, family history, past medical history, past social history, past surgical history and problem list were reviewed and updated as appropriate.   Previously (copied from previous notes for reference):   09/29/2015: She presents for initial consultation for her sleep disturbance, in particular her obstructive sleep  apnea. The patient is unaccompanied today. She was referred for a sleep study in 2015 by Dr. Jim Like for a complaint of snoring and excessive daytime somnolence and memory issues. She had a split-night sleep study on 12/10/2013. I went over her test results with her in detail today. Sleep efficiency at baseline was 43.3% with a sleep latency of 135.5 minutes and wake after sleep onset of 8 minutes with moderate sleep fragmentation noted. She had no significant PLMS, EKG or EEG changes. Mild to moderate snoring was noted. Total AHI was 25 per hour, average oxygen saturation was 93%, nadir was 77% during REM sleep. She was then titrated on CPAP. Sleep efficiency was 93% during the later part of the study. She had an average oxygen saturation of 95%, nadir was 88%. CPAP was titrated from 5 cm to 7 cm. AHI was 1.6 per hour at the final pressure. REM sleep was achieved, supine REM sleep was not achieved. Based on her test results and her sleep-related complaints I prescribed CPAP therapy for home use. The patient did not return for an appointment in sleep clinic after that. She reports ongoing short-term memory issues. She has gained weight in the past couple of years. Her current weight is 270 pounds. She works full-time. She has 3 children, ages 92, 69 and nearly 47 years old. Her Epworth sleepiness score is 9 out of 24, her fatigue score is 54 out of 63. She denies nocturia and restless leg symptoms. She has had some nocturnal reflux symptoms. She is in the process of  being evaluated for bariatric surgery. She would be willing to get on CPAP therapy at this time. Bedtime is around 9, wakeup time is around 5. She drinks alcohol very rarely, maybe once or so per year, caffeine not on a regular basis, and does not smoke. She lives at home with her husband and 3 children. She has seen Dr. Redmond Pulling for possible gastric bypass surgery.   Her Past Medical History Is Significant For: Past Medical History:  Diagnosis  Date  . Obese     Her Past Surgical History Is Significant For: Past Surgical History:  Procedure Laterality Date  . [place]   x 1  . GASTRIC ROUX-EN-Y N/A 05/28/2016   Procedure: LAPAROSCOPIC ROUX-EN-Y GASTRIC BYPASS WITH UPPER ENDOSCOPY;  Surgeon: Greer Pickerel, MD;  Location: [facility];  Service: General;  Laterality: N/A;  . None listed    . PARTIAL HYSTERECTOMY  2001  . TUBAL LIGATION    . UPPER GI ENDOSCOPY  05/28/2016   Procedure: UPPER GI ENDOSCOPY;  Surgeon: Greer Pickerel, MD;  Location: [facility];  Service: General;;    Her Family History Is Significant For: No family history on file.  Her Social History Is Significant For: Social History   Social History  . Marital status: Married    Spouse name: Legrand Como   . Number of children: 2  . Years of education: 16   Occupational History  .      [employer]   Social History Main Topics  . Smoking status: Never Smoker  . Smokeless tobacco: Never Used  . Alcohol use No  . Drug use: No  . Sexual activity: Not Asked   Other Topics Concern  . None   Social History Narrative   Patient lives at home with Legrand Como.    Patient has 2 children.    Patient is right handed.    Patient has a college education.    Caffeine seldom, maybe a soda    Her Allergies Are:  Allergies  Allergen Reactions  . Codeine Nausea And Vomiting  :   Her Current Medications Are:  Outpatient Encounter Prescriptions as of 07/25/2016  Medication Sig  . valACYclovir (VALTREX) 500 MG tablet Take 500 mg by mouth daily as needed (outbreaks).   . [DISCONTINUED] acetaminophen (TYLENOL) 500 MG tablet Take 1,000 mg by mouth every 6 (six) hours as needed for mild pain or moderate pain.  . [DISCONTINUED] oxyCODONE (ROXICODONE) 5 MG/5ML solution Take 5-10 mLs (5-10 mg total) by mouth every 4 (four) hours as needed for moderate pain or severe pain.   No facility-administered encounter medications on file as of 07/25/2016.   :  Review of Systems:   Out of a complete 14 point review of systems, all are reviewed and negative with the exception of these symptoms as listed below:  Review of Systems  Neurological:       Patient states that she is doing ok with CPAP. She states that it was uncomfortable to wear when she had braids in her hair, therefore she did not wear it during that time.     Objective:  Neurologic Exam  Physical Exam Physical Examination:   Vitals:   07/25/16 1004  BP: 116/68  Pulse: 70   General Examination: The patient is a 36 y.o. female in no acute distress. She appears well-developed and well-nourished and well groomed. No braids.  HEENT: Normocephalic, atraumatic, pupils are equal, round and reactive to light and accommodation. Extraocular tracking is good without  limitation to gaze excursion or nystagmus noted. Hearing is grossly intact. Face is symmetric with normal facial animation and normal facial sensation. Speech is clear with no dysarthria noted. There is no hypophonia. There is no lip, neck/head, jaw or voice tremor. Neck is supple with full range of passive and active motion. There are no carotid bruits on auscultation. Oropharynx exam reveals: mild mouth dryness, good dental hygiene and marked airway crowding, mild pharyngeal erythema.  Chest: Clear to auscultation without wheezing, rhonchi or crackles noted. Some coarse sounds.  Heart: S1+S2+0, regular and normal without murmurs, rubs or gallops noted.   Abdomen: Soft, non-tender and non-distended with normal bowel sounds appreciated on auscultation.  Extremities: There is no pitting edema in the distal lower extremities bilaterally. Pedal pulses are intact. She has a scar on her left forearm and wrist, from an injury.  Skin: Warm and dry without trophic changes noted. There are no varicose veins.  Musculoskeletal: exam reveals no obvious joint deformities, tenderness or joint swelling or erythema.   Neurologically:  Mental status: The  patient is awake, alert and oriented in all 4 spheres. Her immediate and remote memory, attention, language skills and fund of knowledge are appropriate. Mood is normal and affect is somewhat flat.  Cranial nerves II - XII are as described above under HEENT exam.  Motor exam: Normal bulk, strength and tone is noted. There is no tremor or rebound. Romberg is negative. Reflexes are 1+ throughout. Fine motor skills and coordination: intact grossly.   Cerebellar testing: No dysmetria or intention tremor. There is no truncal or gait ataxia.  Sensory exam: intact to light touch.  Gait, station and balance: She stands easily. No veering to one side is noted. No leaning to one side is noted. Posture is age-appropriate and stance is narrow based. Gait shows normal stride length and normal pace. No problems turning are noted. Tandem walk is difficult for her, unchanged.   Assessment and Plan:  In summary, Caitlin Wood is a 36 year old female with an underlying medical history of low back pain and morbid obesity, status post recent weight loss surgery in March 2018, who presents for follow-up consultation of her moderate obstructive sleep apnea, as determined by a split-night sleep study in September 2015. I started her on CPAP therapy last year. She is currently not fully compliant with it, in fact, in the past 30 days, compliance has been poor. She is advised to get back on CPAP therapy and be fully compliant with treatment. I explained the importance of being compliant with CPAP not just for insurance purposes but also to help improve sleep-related symptoms. I suggested a three-month checkup with one of our nurse picked her snores for compliance recheck.  I answered all her questions today and she was in agreement. I spent 15 minutes in total face-to-face time with the patient, more than 50% of which was spent in counseling and coordination of care, reviewing test results, reviewing medication and discussing  or reviewing the diagnosis of OSA, its prognosis and treatment options. Pertinent laboratory and imaging test results that were available during this visit with the patient were reviewed by me and considered in my medical decision making (see chart for details).

## 2016-07-25 NOTE — Patient Instructions (Signed)
Please continue using your CPAP regularly. While your insurance requires that you use CPAP at least 4 hours each night on 70% of the nights, I recommend, that you not skip any nights and use it throughout the night if you can. Getting used to CPAP and staying with the treatment long term does take time and patience and discipline. Untreated obstructive sleep apnea when it is moderate to severe can have an adverse impact on cardiovascular health and raise her risk for heart disease, arrhythmias, hypertension, congestive heart failure, stroke and diabetes. Untreated obstructive sleep apnea causes sleep disruption, nonrestorative sleep, and sleep deprivation. This can have an impact on your day to day functioning and cause daytime sleepiness and impairment of cognitive function, memory loss, mood disturbance, and problems focussing. Using CPAP regularly can improve these symptoms. We will see you in 3 months, you can see one of our nurse practitioners at the time.

## 2016-11-07 ENCOUNTER — Ambulatory Visit: Payer: BLUE CROSS/BLUE SHIELD | Admitting: Adult Health

## 2016-11-08 ENCOUNTER — Encounter: Payer: Self-pay | Admitting: Adult Health

## 2018-12-31 ENCOUNTER — Encounter (HOSPITAL_COMMUNITY): Payer: Self-pay

## 2020-02-23 ENCOUNTER — Ambulatory Visit (INDEPENDENT_AMBULATORY_CARE_PROVIDER_SITE_OTHER): Payer: BC Managed Care – PPO | Admitting: Professional

## 2020-02-23 DIAGNOSIS — F331 Major depressive disorder, recurrent, moderate: Secondary | ICD-10-CM | POA: Diagnosis not present

## 2020-03-07 ENCOUNTER — Ambulatory Visit (INDEPENDENT_AMBULATORY_CARE_PROVIDER_SITE_OTHER): Payer: BC Managed Care – PPO | Admitting: Professional

## 2020-03-07 DIAGNOSIS — F331 Major depressive disorder, recurrent, moderate: Secondary | ICD-10-CM

## 2020-03-21 ENCOUNTER — Ambulatory Visit: Payer: BC Managed Care – PPO | Admitting: Professional

## 2020-03-30 ENCOUNTER — Ambulatory Visit (INDEPENDENT_AMBULATORY_CARE_PROVIDER_SITE_OTHER): Payer: BC Managed Care – PPO | Admitting: Professional

## 2020-03-30 DIAGNOSIS — F331 Major depressive disorder, recurrent, moderate: Secondary | ICD-10-CM

## 2020-04-04 ENCOUNTER — Ambulatory Visit (INDEPENDENT_AMBULATORY_CARE_PROVIDER_SITE_OTHER): Payer: BC Managed Care – PPO | Admitting: Professional

## 2020-04-04 DIAGNOSIS — F331 Major depressive disorder, recurrent, moderate: Secondary | ICD-10-CM | POA: Diagnosis not present

## 2020-04-07 ENCOUNTER — Ambulatory Visit (INDEPENDENT_AMBULATORY_CARE_PROVIDER_SITE_OTHER): Payer: BC Managed Care – PPO | Admitting: Professional

## 2020-04-07 DIAGNOSIS — F331 Major depressive disorder, recurrent, moderate: Secondary | ICD-10-CM

## 2020-04-11 ENCOUNTER — Ambulatory Visit (INDEPENDENT_AMBULATORY_CARE_PROVIDER_SITE_OTHER): Payer: BC Managed Care – PPO | Admitting: Professional

## 2020-04-11 DIAGNOSIS — F331 Major depressive disorder, recurrent, moderate: Secondary | ICD-10-CM

## 2020-04-14 ENCOUNTER — Ambulatory Visit (INDEPENDENT_AMBULATORY_CARE_PROVIDER_SITE_OTHER): Payer: BC Managed Care – PPO | Admitting: Professional

## 2020-04-14 DIAGNOSIS — F331 Major depressive disorder, recurrent, moderate: Secondary | ICD-10-CM | POA: Diagnosis not present

## 2020-04-18 ENCOUNTER — Ambulatory Visit (INDEPENDENT_AMBULATORY_CARE_PROVIDER_SITE_OTHER): Payer: BC Managed Care – PPO | Admitting: Professional

## 2020-04-18 DIAGNOSIS — F331 Major depressive disorder, recurrent, moderate: Secondary | ICD-10-CM

## 2020-04-25 ENCOUNTER — Ambulatory Visit: Payer: BC Managed Care – PPO | Admitting: Professional

## 2020-05-02 ENCOUNTER — Ambulatory Visit (INDEPENDENT_AMBULATORY_CARE_PROVIDER_SITE_OTHER): Payer: BC Managed Care – PPO | Admitting: Professional

## 2020-05-02 DIAGNOSIS — F331 Major depressive disorder, recurrent, moderate: Secondary | ICD-10-CM | POA: Diagnosis not present

## 2020-05-09 ENCOUNTER — Ambulatory Visit (INDEPENDENT_AMBULATORY_CARE_PROVIDER_SITE_OTHER): Payer: BC Managed Care – PPO | Admitting: Professional

## 2020-05-09 DIAGNOSIS — F331 Major depressive disorder, recurrent, moderate: Secondary | ICD-10-CM | POA: Diagnosis not present

## 2020-05-16 ENCOUNTER — Ambulatory Visit: Payer: BC Managed Care – PPO | Admitting: Professional

## 2020-05-23 ENCOUNTER — Ambulatory Visit (INDEPENDENT_AMBULATORY_CARE_PROVIDER_SITE_OTHER): Payer: BC Managed Care – PPO | Admitting: Professional

## 2020-05-23 DIAGNOSIS — F331 Major depressive disorder, recurrent, moderate: Secondary | ICD-10-CM

## 2020-05-30 ENCOUNTER — Ambulatory Visit (INDEPENDENT_AMBULATORY_CARE_PROVIDER_SITE_OTHER): Payer: BC Managed Care – PPO | Admitting: Professional

## 2020-05-30 DIAGNOSIS — F331 Major depressive disorder, recurrent, moderate: Secondary | ICD-10-CM

## 2020-06-06 ENCOUNTER — Ambulatory Visit: Payer: BC Managed Care – PPO | Admitting: Professional

## 2020-06-13 ENCOUNTER — Ambulatory Visit (INDEPENDENT_AMBULATORY_CARE_PROVIDER_SITE_OTHER): Payer: BC Managed Care – PPO | Admitting: Professional

## 2020-06-13 DIAGNOSIS — F331 Major depressive disorder, recurrent, moderate: Secondary | ICD-10-CM

## 2020-06-20 ENCOUNTER — Ambulatory Visit (INDEPENDENT_AMBULATORY_CARE_PROVIDER_SITE_OTHER): Payer: BC Managed Care – PPO | Admitting: Professional

## 2020-06-20 DIAGNOSIS — F331 Major depressive disorder, recurrent, moderate: Secondary | ICD-10-CM

## 2020-06-27 ENCOUNTER — Ambulatory Visit: Payer: BC Managed Care – PPO | Admitting: Professional

## 2020-07-04 ENCOUNTER — Ambulatory Visit (INDEPENDENT_AMBULATORY_CARE_PROVIDER_SITE_OTHER): Payer: BC Managed Care – PPO | Admitting: Professional

## 2020-07-04 DIAGNOSIS — F4322 Adjustment disorder with anxiety: Secondary | ICD-10-CM

## 2020-07-04 DIAGNOSIS — F332 Major depressive disorder, recurrent severe without psychotic features: Secondary | ICD-10-CM | POA: Diagnosis not present

## 2020-07-11 ENCOUNTER — Ambulatory Visit: Payer: BC Managed Care – PPO | Admitting: Professional

## 2020-08-01 ENCOUNTER — Ambulatory Visit: Payer: BC Managed Care – PPO | Admitting: Professional

## 2020-08-02 ENCOUNTER — Ambulatory Visit (INDEPENDENT_AMBULATORY_CARE_PROVIDER_SITE_OTHER): Payer: BC Managed Care – PPO | Admitting: Professional

## 2020-08-02 DIAGNOSIS — F4322 Adjustment disorder with anxiety: Secondary | ICD-10-CM

## 2020-08-02 DIAGNOSIS — F332 Major depressive disorder, recurrent severe without psychotic features: Secondary | ICD-10-CM | POA: Diagnosis not present

## 2020-08-29 ENCOUNTER — Ambulatory Visit (INDEPENDENT_AMBULATORY_CARE_PROVIDER_SITE_OTHER): Payer: BC Managed Care – PPO | Admitting: Professional

## 2020-08-29 DIAGNOSIS — F4322 Adjustment disorder with anxiety: Secondary | ICD-10-CM | POA: Diagnosis not present

## 2020-08-29 DIAGNOSIS — F332 Major depressive disorder, recurrent severe without psychotic features: Secondary | ICD-10-CM | POA: Diagnosis not present

## 2020-09-12 ENCOUNTER — Ambulatory Visit (INDEPENDENT_AMBULATORY_CARE_PROVIDER_SITE_OTHER): Payer: BC Managed Care – PPO | Admitting: Professional

## 2020-09-12 DIAGNOSIS — F332 Major depressive disorder, recurrent severe without psychotic features: Secondary | ICD-10-CM

## 2020-09-12 DIAGNOSIS — F4322 Adjustment disorder with anxiety: Secondary | ICD-10-CM

## 2020-09-26 ENCOUNTER — Ambulatory Visit: Payer: BC Managed Care – PPO | Admitting: Professional

## 2020-10-10 ENCOUNTER — Ambulatory Visit (INDEPENDENT_AMBULATORY_CARE_PROVIDER_SITE_OTHER): Payer: BC Managed Care – PPO | Admitting: Professional

## 2020-10-10 DIAGNOSIS — F4322 Adjustment disorder with anxiety: Secondary | ICD-10-CM

## 2020-10-10 DIAGNOSIS — F332 Major depressive disorder, recurrent severe without psychotic features: Secondary | ICD-10-CM

## 2020-10-24 ENCOUNTER — Ambulatory Visit (INDEPENDENT_AMBULATORY_CARE_PROVIDER_SITE_OTHER): Payer: BC Managed Care – PPO | Admitting: Professional

## 2020-10-24 DIAGNOSIS — F332 Major depressive disorder, recurrent severe without psychotic features: Secondary | ICD-10-CM

## 2020-10-24 DIAGNOSIS — F4322 Adjustment disorder with anxiety: Secondary | ICD-10-CM

## 2020-11-07 ENCOUNTER — Ambulatory Visit: Payer: BC Managed Care – PPO | Admitting: Professional

## 2020-11-14 ENCOUNTER — Ambulatory Visit (INDEPENDENT_AMBULATORY_CARE_PROVIDER_SITE_OTHER): Payer: BC Managed Care – PPO | Admitting: Professional

## 2020-11-14 DIAGNOSIS — F4322 Adjustment disorder with anxiety: Secondary | ICD-10-CM | POA: Diagnosis not present

## 2020-11-14 DIAGNOSIS — F332 Major depressive disorder, recurrent severe without psychotic features: Secondary | ICD-10-CM | POA: Diagnosis not present

## 2020-12-05 ENCOUNTER — Ambulatory Visit: Payer: BC Managed Care – PPO | Admitting: Professional

## 2020-12-26 ENCOUNTER — Ambulatory Visit (INDEPENDENT_AMBULATORY_CARE_PROVIDER_SITE_OTHER): Payer: BC Managed Care – PPO | Admitting: Professional

## 2020-12-26 DIAGNOSIS — F4322 Adjustment disorder with anxiety: Secondary | ICD-10-CM | POA: Diagnosis not present

## 2020-12-26 DIAGNOSIS — F332 Major depressive disorder, recurrent severe without psychotic features: Secondary | ICD-10-CM

## 2021-02-06 ENCOUNTER — Ambulatory Visit (INDEPENDENT_AMBULATORY_CARE_PROVIDER_SITE_OTHER): Payer: BC Managed Care – PPO | Admitting: Professional

## 2021-02-06 DIAGNOSIS — F4322 Adjustment disorder with anxiety: Secondary | ICD-10-CM

## 2021-02-06 DIAGNOSIS — F332 Major depressive disorder, recurrent severe without psychotic features: Secondary | ICD-10-CM

## 2021-03-22 ENCOUNTER — Ambulatory Visit (INDEPENDENT_AMBULATORY_CARE_PROVIDER_SITE_OTHER): Payer: BC Managed Care – PPO | Admitting: Professional

## 2021-03-22 ENCOUNTER — Encounter: Payer: Self-pay | Admitting: Professional

## 2021-03-22 DIAGNOSIS — F331 Major depressive disorder, recurrent, moderate: Secondary | ICD-10-CM | POA: Diagnosis not present

## 2021-03-22 DIAGNOSIS — F4322 Adjustment disorder with anxiety: Secondary | ICD-10-CM | POA: Diagnosis not present
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
## 2021-04-04 ENCOUNTER — Ambulatory Visit (INDEPENDENT_AMBULATORY_CARE_PROVIDER_SITE_OTHER): Payer: BLUE CROSS/BLUE SHIELD | Admitting: Professional

## 2021-04-04 ENCOUNTER — Encounter: Payer: Self-pay | Admitting: Professional

## 2021-04-04 DIAGNOSIS — F4322 Adjustment disorder with anxiety: Secondary | ICD-10-CM

## 2021-04-04 DIAGNOSIS — F331 Major depressive disorder, recurrent, moderate: Secondary | ICD-10-CM | POA: Diagnosis not present

## 2021-04-04 NOTE — Progress Notes (Signed)
[facility] Counselor/Therapist Progress Note  Patient ID: Caitlin Wood, MRN: [id number],    Date: 04/04/2021  Time Spent: 54 minutes [id number] am  Treatment Type: Individual Therapy  Reported Symptoms: confused, overwhelmed with marital issues, feels unsafe  Mental Status Exam: Appearance:  Neat     Behavior: Appropriate and Sharing  Motor: Normal  Speech/Language:  Clear and Coherent and Normal Rate  Affect: Full Range  Mood: labile  Thought process: goal directed  Thought content:   WNL  Sensory/Perceptual disturbances:   WNL  Orientation: oriented to person, place, time/date, and situation  Attention: Good  Concentration: Good  Memory: WNL  Fund of knowledge:  Good  Insight:   Good  Judgment:  Good  Impulse Control: Good   Risk Assessment: Danger to Self:  No Self-injurious Behavior: No Danger to Others: No Duty to Warn:no Physical Aggression / Violence:No  Access to Firearms a concern: No  Gang Involvement:No   Subjective: This session was held via video teletherapy due to the coronavirus risk at this time. The patient consented to video teletherapy and was located at her home during this session. She is aware it is the responsibility of the patient to secure confidentiality on her end of the session. The provider was in a private home office for the duration of this session.   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   -grandmother (GM) called the office to ask questions   -GM had been inappropriate in favoring Shea Stakes     -she is very verbal about  the favoritism   -pt discontinued the family sessions   -HR contacted pt and asked for a meeting regarding a pt     -new HR person was snarky   -meeting was with HR, pt's boss and senior leadership     -she was listened too and they advised no family session 3-mood -mildly depressed and tired -she reports she is not as significantly depressed as when we initially met   -difficulty in answering Za's questions   -man that she had been speaking with separated himself from the situation     -he told her to contact him after the divorce     -she is disappointed because she really enjoyed her time with him       -she really misses their interactions and admits she is grieving 4-coping -how to focus on herself and her family     Problems Addressed  Anxiety, Childhood Trauma, Intimate Relationship Conflicts, Unipolar Depression Goals 1. Alleviate depressive symptoms and return to previous level of effective functioning. 2. Appropriately grieve the loss in order to normalize mood and to return to previously adaptive level of functioning. Objective Verbalize an understanding of healthy and unhealthy emotions with the intent of increasing the use of healthy emotions to guide actions. Target Date: 2021-04-02 Frequency: Weekly  Progress: 70 Modality: individual  Related Interventions Use a process-experiential approach consistent with Emotion-Focused Therapy to create a safe, nurturing environment in which the client can process emotions, learning to identify and regulate unhealthy feelings and  to generate more adaptive ones that then guide actions (see Emotion-Focused Therapy for Depression by Andrey Spearman). Objective Learn and implement conflict resolution skills to resolve interpersonal problems. Target Date: 2021-04-02 Frequency: Weekly  Progress: 40 Modality: individual  Related Interventions Teach conflict resolution skills (e.g., empathy, active listening, "I messages," respectful  communication, assertiveness without aggression, compromise); use psychoeducation, modeling, role-playing, and rehearsal to work through several current conflicts; assign homework exercises; review and repeat so as to integrate their use into the client's life. Help the client resolve depression related to interpersonal problems through the use of reassurance and support, clarification of cognitive and affective triggers that ignite conflicts, and active problem-solving (or assign "Applying Problem-Solving to Interpersonal Conflict" in the Adult Psychotherapy Homework Planner by Bryn Gulling). Objective Learn and implement problem-solving and decision-making skills. Target Date: 2021-04-02 Frequency: Weekly  Progress: 60 Modality: individual  Objective Verbalize an understanding and resolution of current interpersonal problems. Target Date: 2021-04-02 Frequency: Weekly  Progress: 50 Modality: individual  Related Interventions For grief, facilitate mourning and gradually help client discover new activities and relationships to compensate for the loss. For interpersonal disputes, help the client explore the relationship, the nature of the dispute, whether it has reached an impasse, and available options to resolve it including learning and implementing conflict-resolution skills; if the relationship has reached an impasse, consider ways to change the impasse or to end the relationship. For role transitions (e.g., beginning or ending a relationship or career, moving, promotion, retirement, graduation), help the client mourn the loss of the old role while recognizing positive and negative aspects of the new role, and taking steps to gain mastery over the new role. For interpersonal deficits, help the client develop new interpersonal skills and relationships. Objective Identify and replace thoughts and beliefs that support depression. Target Date: 2021-04-02 Frequency: Weekly  Progress: 80 Modality:  individual  Related Interventions Assign the client to self-monitor thoughts, feelings, and actions in daily journal (e.g., "Negative Thoughts Trigger Negative Feelings" in the Adult Psychotherapy Homework Planner by Jongsma; "Daily Record of Dysfunctional Thoughts" in Cognitive Therapy of Depression by Lupita Shutter, and Warren Lacy); process the journal material to challenge depressive thinking patterns and replace them with reality-based thoughts. Assign "behavioral experiments" in which depressive automatic thoughts are treated as hypotheses/prediction, reality-based alternative hypotheses/prediction are generated, and both are tested against the client's past, present, and/or future experiences. Facilitate and reinforce the client's shift from biased depressive self-talk and beliefs to reality-based cognitive messages that enhance self-confidence and increase adaptive actions (see "Positive Self-Talk" in the Adult Psychotherapy Homework Planner by Bryn Gulling). Explore and restructure underlying assumptions and beliefs reflected in biased self-talk that may put the client at risk for relapse or recurrence. Objective Learn and implement behavioral strategies to overcome depression. Target Date: 2021-04-02 Frequency: Weekly  Progress: 80 Modality: individual  Related Interventions Engage the client in "behavioral activation," increasing his/her activity level and contact with sources of reward, while identifying processes that inhibit activation (see Behavioral Activation for Depression by Beverly Gust, Dimidjian, and Herman-Dunn; or assign "Identify and Schedule Pleasant Activities" in the Adult Psychotherapy Homework Planner by [facility]); use behavioral techniques such as instruction, rehearsal, role-playing, role reversal, as needed, to facilitate activity in the client's daily life; reinforce success. Objective Learn and implement relapse prevention skills. Target Date: 2021-04-02 Frequency: Weekly   Progress: 0 Modality: individual  Related Interventions Discuss with the client the distinction between a lapse and relapse, associating a lapse with a rather common, temporary setback that may involve, for example,  re-experiencing a depressive thought and/or urge to withdraw or avoid (perhaps as related to some loss or conflict) and a relapse as a sustained return to a pattern of depressive thinking and feeling usually accompanied by interpersonal withdrawal and/or avoidance. Identify and rehearse with the client the management of future situations or circumstances in which lapses could occur. Build the client's relapse prevention skills by helping him/her identify early warning signs of relapse and rehearsing the use of skills learned during therapy to manage them. Objective Implement mindfulness techniques for relapse prevention. Target Date: 2021-04-02 Frequency: Weekly  Progress: 0 Modality: individual  Related Interventions Use mindfulness meditation and cognitive therapy techniques to help the client learn to recognize and regulate the negative thought processes associated with depression and to change his/her relationship with these thoughts (see Mindfulness-Based Cognitive Therapy for Depression by Walden Field, and Clyde Canterbury). Work to increase the client's new sense of well-being by building his/her personal strengths evident in their progress through therapy (or assign "Acknowledging My Strengths" and/or "What Are My Good Qualities?" in the Adult Psychotherapy Homework Planner by Bryn Gulling). 3. Develop an awareness of how childhood issues have affected and continue to affect one's family life. 4. Develop healthy interpersonal relationships that lead to the alleviation and help prevent the relapse of depression. 5. Develop healthy thinking patterns and beliefs about self, others, and the world that lead to the alleviation and help prevent the relapse of depression. 6. Develop the necessary  skills for effective, open communication, mutually satisfying sexual intimacy, and enjoyable time for companionship within the relationship. 7. Enhance ability to effectively cope with the full variety of life's worries and anxieties. 8. Increase awareness of own role in the relationship conflicts. 9. Learn and implement coping skills that result in a reduction of anxiety and worry, and improved daily functioning. 10. Learn to identify escalating behaviors that lead to abuse. 11. Let go of blame and begin to forgive others for pain caused in childhood. Objective Describe what it was like to grow up in the home environment. Target Date: 2021-04-02 Frequency: Weekly  Progress: 40 Modality: individual  Objective Identify patterns of abuse, neglect, or abandonment within the family of origin, both current and historical, nuclear and extended. Target Date: 2021-04-02 Frequency: Weekly  Progress: 60 Modality: individual  Related Interventions Explore the client's painful childhood experiences (or assign "Share the Painful Memory" in the Adult Psychotherapy Homework Planner by Bryn Gulling). Objective Identify how own parenting has been influenced by childhood experiences. Target Date: 2021-04-02 Frequency: Weekly  Progress: 30 Modality: individual  Related Interventions Ask the client to compare his/her parenting behavior to that of parent figures of his/her childhood; encourage the client to be aware of how easily we repeat patterns that we grew up with. Objective Identify feelings associated with major traumatic incidents in childhood and with parental child-rearing patterns. Target Date: 2021-04-02 Frequency: Weekly  Progress: 30 Modality: individual  Related Interventions Support and encourage the client when he/she begins to express feelings of rage, sadness, fear, and rejection relating to family abuse or neglect. Assign the client to record feelings in a journal that describes memories,  behavior, and emotions tied to his/her traumatic childhood experiences (or assign "How the Trauma Affects Me" in the Adult Psychotherapy Homework Planner by Bryn Gulling). Objective Identify the positive aspects for self of being able to forgive all those involved with the abuse. Target Date: 2021-04-02 Frequency: Weekly  Progress: 30 Modality: individual  Related Interventions Teach the client the benefits (i.e., release of hurt and anger,  putting issue in the past, opens door for trust of others, etc.) of beginning a process of forgiveness of (not necessarily forgetting or fraternizing with) abusive adults. Objective Increase level of trust of others as shown by more socialization and greater intimacy tolerance. Target Date: 2021-04-02 Frequency: Weekly  Progress: 30 Modality: individual  Related Interventions Teach the client the share-check method of building trust in relationships (sharing a little information and checking as to the recipient's sensitivity in reacting to that information). Teach the client the advantages of treating people as trustworthy given a reasonable amount of time to assess their character. 12. Rebuild positive self-image after acceptance of the rejection associated with the broken relationship. Objective Identify problems and strengths in the relationship, including one's own role in each. Target Date: 2021-04-02 Frequency: Weekly  Progress: 40 Modality: individual  Objective Learn and implement problem-solving and conflict resolution skills. Target Date: 2021-04-02 Frequency: Weekly  Progress: 50 Modality: individual  Related Interventions Review how newly learned communication skills can be applied to conflict resolution through calm, respectful, effective dialogue; role-play application of this skill to a present conflict situation. Use behavioral techniques (education, modeling, role-playing, corrective feedback, and positive reinforcement) to teach the couple  problem-solving and conflict resolution skills including defining the problem constructively and specifically, brainstorming options, evaluating options, compromise, choosing options and implementing a plan, evaluating the results. Assign the couple a homework exercise to use and record newly learned problem-solving and conflict resolution skills (or assign "Applying Problem-Solving to Interpersonal Conflict" in the Adult Psychotherapy Homework Planner by [facility]); process results in session. Objective Increase flexibility of expectations, willingness to compromise, and acceptance of irreconcilable differences. Target Date: 2021-04-02 Frequency: Weekly  Progress: 70 Modality: individual  Objective Make a commitment to change specific behaviors that have been identified by self or the partner. Target Date: 2021-04-02 Frequency: Weekly  Progress: 70 Modality: individual  13. Recognize, accept, and cope with feelings of depression. 14. Reduce overall frequency, intensity, and duration of the anxiety so that daily functioning is not impaired. 15. Release the emotions associated with past childhood/family issues, resulting in less resentment and more serenity. 16. Resolve past childhood/family issues, leading to less anger and depression, greater self-esteem, security, and confidence. 17. Resolve the core conflict that is the source of anxiety. Objective Describe situations, thoughts, feelings, and actions associated with anxieties and worries, their impact on functioning, and attempts to resolve them. Target Date: 2021-04-02 Frequency: Weekly  Progress: 70 Modality: individual  Related Interventions Ask the client to describe his/her past experiences of anxiety and their impact on functioning; assess the focus, excessiveness, and uncontrollability of the worry and the type, frequency, intensity, and duration of his/her anxiety symptoms (consider using a structured interview such as The Anxiety  Disorders Interview Schedule-Adult Version). Objective Learn and implement calming skills to reduce overall anxiety and manage anxiety symptoms. Target Date: 2021-04-02 Frequency: Weekly  Progress: 60 Modality: individual  Related Interventions Teach the client calming/relaxation skills (e.g., applied relaxation, progressive muscle relaxation, cue controlled relaxation; mindful breathing; biofeedback) and how to discriminate better between relaxation and tension; teach the client how to apply these skills to his/her daily life (e.g., New Directions in Progressive Muscle Relaxation by Casper Harrison, and Hazlett-Stevens; Treating Generalized Anxiety Disorder by Rygh and Amparo Bristol). Assign the client homework each session in which he/she practices relaxation exercises daily, gradually applying them progressively from non-anxiety-provoking to anxiety-provoking situations; review and reinforce success while providing corrective feedback toward improvement. Objective Learn and implement a strategy to limit the association  between various environmental settings and worry, delaying the worry until a designated "worry time." Target Date: 2021-04-02 Frequency: Weekly  Progress: 80 Modality: individual  Related Interventions Explain the rationale for using a worry time as well as how it is to be used; agree upon and implement a worry time with the client. Teach the client how to recognize, stop, and postpone worry to the agreed upon worry time using skills such as thought stopping, relaxation, and redirecting attention (or assign "Making Use of the Thought-Stopping Technique" and/or "Worry Time" in the Adult Psychotherapy Homework Planner by Jongsma to assist skill development); encourage use in daily life; review and reinforce success while providing corrective feedback toward improvement. Objective Verbalize an understanding of the cognitive, physiological, and behavioral components of anxiety and its  treatment. Target Date: 2021-04-02 Frequency: Weekly  Progress: 70 Modality: individual  Related Interventions Discuss how generalized anxiety typically involves excessive worry about unrealistic threats, various bodily expressions of tension, overarousal, and hypervigilance, and avoidance of what is threatening that interact to maintain the problem (see Mastery of Your Anxiety and Worry: Therapist Guide by Corky Mull, and Barlow; Treating Generalized Anxiety Disorder by Rygh and Amparo Bristol). Discuss how treatment targets worry, anxiety symptoms, and avoidance to help the client manage worry effectively, reduce overarousal, and eliminate unnecessary avoidance. Objective Identify, challenge, and replace biased, fearful self-talk with positive, realistic, and empowering self-talk. Target Date: 2021-04-02 Frequency: Weekly  Progress: 70 Modality: individual  Related Interventions Explore the client's schema and self-talk that mediate his/her fear response; assist him/her in challenging the biases; replace the distorted messages with reality-based alternatives and positive, realistic self-talk that will increase his/her self-confidence in coping with irrational fears (see Cognitive Therapy of Anxiety Disorders by Alison Stalling). Assign the client a homework exercise in which he/she identifies fearful self-talk, identifies biases in the self-talk, generates alternatives, and tests through behavioral experiments (or assign "Negative Thoughts Trigger Negative Feelings" in the Adult Psychotherapy Homework Planner by [facility]); review and reinforce success, providing corrective feedback toward improvement. Objective Learn and implement problem-solving strategies for realistically addressing worries. Target Date: 2021-04-02 Frequency: Weekly  Progress: 50 Modality: individual  Related Interventions Teach the client problem-solving strategies involving specifically defining a problem, generating options  for addressing it, evaluating the pros and cons of each option, selecting and implementing an optional action, and reevaluating and refining the action (or assign "Applying Problem-Solving to Interpersonal Conflict" in the Adult Psychotherapy Homework Planner by Bryn Gulling). Assign the client a homework exercise in which he/she problem-solves a current problem (see Mastery of Your Anxiety and Worry: Workbook by Adora Fridge and Eliot Ford or Generalized Anxiety Disorder by Eather Colas, and Eliot Ford); review, reinforce success, and provide corrective feedback toward improvement. Objective Learn and implement personal and interpersonal skills to reduce anxiety and improve interpersonal relationships. Target Date: 2021-04-02 Frequency: Weekly  Progress: 50 Modality: individual  Related Interventions Use instruction, modeling, and role-playing to build the client's general social, communication, and/or conflict resolution skills. Assign the client a homework exercise in which he/she implements communication skills training into his/her daily life (or assign "Restoring Socialization Comfort" in the Adult Psychotherapy Homework Planner by [facility]); review, reinforce success, and provide corrective feedback toward improvement. Objective Identify and engage in pleasant activities on a daily basis. Target Date: 2021-04-02 Frequency: Weekly  Progress: 90 Modality: individual  Related Interventions Engage the client in behavioral activation, increasing the client's contact with sources of reward, identifying processes that inhibit activation, and teaching skills to solve life problems (or assign "Identify and  Schedule Pleasant Activities" in the Adult Psychotherapy Homework Planner by [facility]); use behavioral techniques such as instruction, rehearsal, role-playing, role reversal as needed to assist adoption into the client's daily life; reinforce success. 18. Stabilize anxiety level while increasing ability to function on a  daily basis.  Diagnosis: Major depressive disorder, recurrent episode, moderate (HCC)  Adjustment disorder with anxiety  Plan:  -meet again on Monday, April 17, 2021 at [place], [place], [facility]

## 2021-04-17 ENCOUNTER — Ambulatory Visit (INDEPENDENT_AMBULATORY_CARE_PROVIDER_SITE_OTHER): Payer: BLUE CROSS/BLUE SHIELD | Admitting: Professional

## 2021-04-17 ENCOUNTER — Encounter: Payer: Self-pay | Admitting: Professional

## 2021-04-17 DIAGNOSIS — F331 Major depressive disorder, recurrent, moderate: Secondary | ICD-10-CM

## 2021-04-17 DIAGNOSIS — F4322 Adjustment disorder with anxiety: Secondary | ICD-10-CM
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
## 2021-10-18 ENCOUNTER — Encounter: Payer: Self-pay | Admitting: Plastic Surgery

## 2021-10-18 ENCOUNTER — Ambulatory Visit (INDEPENDENT_AMBULATORY_CARE_PROVIDER_SITE_OTHER): Payer: 59 | Admitting: Plastic Surgery

## 2021-10-18 VITALS — BP 129/84 | HR 92 | Ht 65.0 in | Wt 209.8 lb

## 2021-10-18 DIAGNOSIS — R21 Rash and other nonspecific skin eruption: Secondary | ICD-10-CM | POA: Diagnosis not present

## 2021-10-18 DIAGNOSIS — M793 Panniculitis, unspecified: Secondary | ICD-10-CM | POA: Diagnosis not present

## 2021-10-18 NOTE — Progress Notes (Signed)
Referring Provider Toma Deiters, MD [address] [place],  [place] [address]   CC:  Abdominal pannus, rashes   Caitlin Wood is an 41 y.o. female.  HPI: 41 year old with abdominal pannus and rashes.  She is a non-smoker and does not have any diabetes.  She uses nystatin powder for treatment of her rashes.  Her abdominal history is notable for C-section, hysterectomy, bariatric surgery in 2018.  She is interested in abdominal panniculectomy.    Allergies  Allergen Reactions   Codeine Nausea And Vomiting    Outpatient Encounter Medications as of 10/18/2021  Medication Sig Note   Calcium Carb-Cholecalciferol (CALCIUM HIGH POTENCY/VITAMIN D) 600-5 MG-MCG TABS Take by mouth.    ferrous sulfate 325 (65 FE) MG EC tablet Take by mouth.    ketorolac (TORADOL) 10 MG tablet Take 10 mg by mouth 3 (three) times daily as needed.    LINZESS 290 MCG CAPS capsule Take 290 mcg by mouth daily.    Multiple Vitamin (MULTI-VITAMIN) tablet Take 1 tablet by mouth daily.    NASCOBAL 500 MCG/0.1ML SOLN Place into both nostrils once a week.    NYSTATIN powder Apply topically 2 (two) times daily.    oxyCODONE-acetaminophen (PERCOCET) 7.5-325 MG tablet Take 1 tablet by mouth 4 (four) times daily as needed.    valACYclovir (VALTREX) 1000 MG tablet Take 1 tablet by mouth daily.    valACYclovir (VALTREX) 500 MG tablet Take 500 mg by mouth daily as needed (outbreaks).  (Patient not taking: Reported on 10/18/2021) 10/09/2013: Received from: External Pharmacy Received Sig:    No facility-administered encounter medications on file as of 10/18/2021.     Past Medical History:  Diagnosis Date   Obese     Past Surgical History:  Procedure Laterality Date   CESAREAN SECTION  1999   x 1   GASTRIC ROUX-EN-Y N/A 05/28/2016   Procedure: LAPAROSCOPIC ROUX-EN-Y GASTRIC BYPASS WITH UPPER ENDOSCOPY;  Surgeon: Gaynelle Adu, MD;  Location: [facility];  Service: General;  Laterality: N/A;   None listed     PARTIAL  HYSTERECTOMY  2001   TUBAL LIGATION     UPPER GI ENDOSCOPY  05/28/2016   Procedure: UPPER GI ENDOSCOPY;  Surgeon: Gaynelle Adu, MD;  Location: [facility];  Service: General;;    No family history on file.  Social History   Social History Narrative   Patient lives at home with Casimiro Needle.    Patient has 2 children.    Patient is right handed.    Patient has a college education.    Caffeine seldom, maybe a soda     Review of Systems General: Denies fevers, chills, weight loss CV: Denies chest pain, shortness of breath, palpitations   Physical Exam    10/18/2021   10:15 AM 07/25/2016   10:04 AM 06/14/2016    9:46 AM  Vitals with BMI  Height 5\' 5"  5\' 4"  5\' 4"   Weight 209 lbs 13 oz 250 lbs 281 lbs 3 oz  BMI 34.91 43 48.4  Systolic 129 116   Diastolic 84 68   Pulse 92 70     General:  No acute distress,  Alert and oriented, Non-Toxic, Normal speech and affect Abdomen: Significant skin excess above and below the umbilicus.  Her pannus hangs below her pubic symphysis.  She does have mild amount of subcutaneous fat but not significant intra-abdominal fat on exam.  Assessment/Plan Patient's rashes may be improved by abdominal panniculectomy.  She is aware that its not  a guarantee but I think this will help her.  She would get additional cosmetic benefit from abdominoplasty but is aware this part it would not be covered by insurance.   Caitlin Wood 10/18/2021, 11:58 AM

## 2021-11-01 ENCOUNTER — Encounter: Payer: Self-pay | Admitting: *Deleted

## 2021-11-07 ENCOUNTER — Telehealth: Payer: Self-pay | Admitting: Plastic Surgery

## 2021-11-07 NOTE — Telephone Encounter (Signed)
Pt is checking on time frame for insurance authorization and scheduling of surgery due to probable change in insurance in the near future and would like a call back.

## 2021-11-09 ENCOUNTER — Telehealth: Payer: Self-pay | Admitting: *Deleted

## 2021-11-09 NOTE — Telephone Encounter (Signed)
Contacted [employer] for auth as the primary payer. Spoke to [facility] w/ call ref: [phone]. She states she is faxing me a medical nec form to be completed. Waiting on fax.  Submitted auth request to [facility] as the secondary payer via [employer]. Pending Auth: [id number]

## 2021-11-13 ENCOUNTER — Telehealth: Payer: Self-pay | Admitting: *Deleted

## 2021-11-13 NOTE — Telephone Encounter (Signed)
Call received from [facility] requesting previous notes with previous weights. Two visits in Epic sent for comparison.

## 2021-11-14 ENCOUNTER — Telehealth: Payer: Self-pay | Admitting: *Deleted

## 2021-11-14 NOTE — Telephone Encounter (Signed)
Contacted [employer] again as we never received medical nec form. Per rep Eunice Blase they sent it to the wrong fax number last time [phone]). Eunice Blase is resending today to the correct fax number. Call ref [phone]

## 2021-11-21 ENCOUNTER — Telehealth: Payer: Self-pay | Admitting: *Deleted

## 2021-11-21 NOTE — Telephone Encounter (Signed)
Auth faxed to [employer] confirmation: This message was sent via FAXCOM, a product from [employer]. [email or web address].biscom.com/                    -------Fax Transmission Report-------  To:               Recipient at [phone] Subject:          [SECURE] Outpatient Surgery Auth Request - Aldea, L. Result:           The transmission was successful. Explanation:      All Pages Ok Pages Sent:       19 Connect Time:     23 minutes, 0 seconds Transmit Time:    11/21/2021 13:35 Transfer Rate:    14400 Status Code:      0000 Retry Count:      0 Job Id:           [id number] Unique Id:        [id number] Fax Line:         27 Fax Server:       [id number]

## 2021-11-21 NOTE — Telephone Encounter (Signed)
Contacted patient about [employer] Denial due to the pictures we have. Transferred her to the front desk to schedule a follow up appointment so additional pictures can be obtained.

## 2021-11-23 ENCOUNTER — Telehealth: Payer: Self-pay | Admitting: *Deleted

## 2021-11-23 NOTE — Telephone Encounter (Signed)
Auth submitted to [employer] via fax  Fax confirmation: This message was sent via FAXCOM, a product from [employer]. [email or web address].biscom.com/                    -------Fax Transmission Report-------  To:               Recipient at [phone] Subject:          FW: [SECURE] Outpatient Surgery Auth Request - Denzer, L. Result:           The transmission was successful. Explanation:      All Pages Ok Pages Sent:       20 Connect Time:     23 minutes, 14 seconds Transmit Time:    11/22/2021 16:12 Transfer Rate:    14400 Status Code:      0000 Retry Count:      0 Job Id:           [id number] Unique Id:        [id number] Fax Line:         37 Fax Server:       [employer]

## 2021-11-24 ENCOUNTER — Ambulatory Visit (INDEPENDENT_AMBULATORY_CARE_PROVIDER_SITE_OTHER): Payer: Self-pay | Admitting: Student

## 2021-11-24 DIAGNOSIS — M793 Panniculitis, unspecified: Secondary | ICD-10-CM

## 2021-11-24 NOTE — Progress Notes (Signed)
Patient presents to the clinic today for preoperative photos.  Pictures were obtained of the patient and placed in the chart with the patient's or guardian's permission.   Chaperone present during the visit.

## 2021-11-29 ENCOUNTER — Telehealth: Payer: Self-pay

## 2021-11-29 NOTE — Telephone Encounter (Signed)
Patient is calling in asking if there is any updates on PA, advised it was submitted on 9/7 and can take 14-30 business days for a response.

## 2021-12-04 NOTE — Progress Notes (Signed)
Faxed reconsideration letter and photos of patients abdomen showing skin irritation and panus hanging over the pubic area.

## 2022-01-09 ENCOUNTER — Telehealth: Payer: Self-pay

## 2022-01-09 NOTE — Telephone Encounter (Signed)
Faxed reconsideration/appeal for panniculectomy to [employer].

## 2022-03-16 ENCOUNTER — Telehealth: Payer: Self-pay

## 2022-03-16 NOTE — Telephone Encounter (Addendum)
Spoke with Amy at [facility] to verify effective dates for CPT code 71219.  Patient is good for 1 year of the date of letter of approval 03/16/22.  At the time appeal was sent in, Dr. Domenica Reamer was the physician patient saw.  Verified that as long as the new provider is with our practice, no changes need to be done.  Will call patient to schedule with Dr. Ulice Bold or Dr. Ladona Ridgel.  Called patient, LMVM in regards to her panniculectomy was approved from 03/16/22-03/17/23.  Also, she will need to call our front desk to schedule a consult with either Dr. Ladona Ridgel or Dr. Ulice Bold.  There will be NO CHARGE.

## 2022-03-20 ENCOUNTER — Ambulatory Visit (INDEPENDENT_AMBULATORY_CARE_PROVIDER_SITE_OTHER): Payer: 59 | Admitting: Plastic Surgery

## 2022-03-20 ENCOUNTER — Encounter: Payer: Self-pay | Admitting: Plastic Surgery

## 2022-03-20 VITALS — BP 111/71 | HR 62 | Ht 65.0 in | Wt 221.0 lb

## 2022-03-20 DIAGNOSIS — M793 Panniculitis, unspecified: Secondary | ICD-10-CM | POA: Diagnosis not present

## 2022-03-20 DIAGNOSIS — M545 Low back pain, unspecified: Secondary | ICD-10-CM | POA: Diagnosis not present

## 2022-03-20 DIAGNOSIS — R21 Rash and other nonspecific skin eruption: Secondary | ICD-10-CM

## 2022-03-20 DIAGNOSIS — Z9884 Bariatric surgery status: Secondary | ICD-10-CM

## 2022-03-20 NOTE — Progress Notes (Signed)
Patient ID: Caitlin Wood, female    DOB: October 14, 1980, 42 y.o.   MRN: [id number]   Chief Complaint  Patient presents with   Skin Problem    The patient is a 42 year old female here for evaluation of her abdomen.  The patient was seen in August by a colleague and approved for a panniculectomy.  She is here to reestablish herself in the practice.  She is not a smoker and does not have diabetes.  She has had to use nystatin creams and different kinds of powders in order to minimize the rashes in her folds.  It has gotten worse over time.  It certainly worse in the hot months of the year.  She has had a C-section and a hysterectomy in the past.  The scar has healed very nicely.  She also had bariatric surgery in 2018.  Since her last visit she increased her weight by 10 pounds.  She is 5 feet 5 inches tall and today is 220 pounds.  Her muscles seem to be strong and I do not palpate any sign of a hernia.    Review of Systems  Constitutional: Negative.   HENT: Negative.    Eyes: Negative.   Respiratory: Negative.  Negative for chest tightness and shortness of breath.   Cardiovascular: Negative.  Negative for leg swelling.  Gastrointestinal: Negative.   Endocrine: Negative.   Genitourinary: Negative.   Musculoskeletal: Negative.   Skin:  Positive for rash.    Past Medical History:  Diagnosis Date   Obese     Past Surgical History:  Procedure Laterality Date   CESAREAN SECTION  1999   x 1   GASTRIC ROUX-EN-Y N/A 05/28/2016   Procedure: LAPAROSCOPIC ROUX-EN-Y GASTRIC BYPASS WITH UPPER ENDOSCOPY;  Surgeon: Greer Pickerel, MD;  Location: [facility];  Service: General;  Laterality: N/A;   None listed     PARTIAL HYSTERECTOMY  2001   TUBAL LIGATION     UPPER GI ENDOSCOPY  05/28/2016   Procedure: UPPER GI ENDOSCOPY;  Surgeon: Greer Pickerel, MD;  Location: [facility];  Service: General;;      Current Outpatient Medications:    Calcium Carb-Cholecalciferol (CALCIUM HIGH POTENCY/VITAMIN D) 600-5  MG-MCG TABS, Take by mouth., Disp: , Rfl:    ferrous sulfate 325 (65 FE) MG EC tablet, Take by mouth., Disp: , Rfl:    LINZESS 290 MCG CAPS capsule, Take 290 mcg by mouth daily., Disp: , Rfl:    Multiple Vitamin (MULTI-VITAMIN) tablet, Take 1 tablet by mouth daily., Disp: , Rfl:    NASCOBAL 500 MCG/0.1ML SOLN, Place into both nostrils once a week., Disp: , Rfl:    NYSTATIN powder, Apply topically 2 (two) times daily., Disp: , Rfl:    oxyCODONE-acetaminophen (PERCOCET) 7.5-325 MG tablet, Take 1 tablet by mouth 4 (four) times daily as needed., Disp: , Rfl:    Objective:   Vitals:   03/20/22 1027  BP: 111/71  Pulse: 62    Physical Exam Vitals and nursing note reviewed.  Constitutional:      Appearance: Normal appearance.  HENT:     Head: Normocephalic and atraumatic.  Cardiovascular:     Rate and Rhythm: Normal rate.     Pulses: Normal pulses.  Pulmonary:     Effort: Pulmonary effort is normal.  Abdominal:     General: There is no distension.     Palpations: There is no mass.     Tenderness: There is no abdominal tenderness.  Hernia: No hernia is present.  Musculoskeletal:        General: Normal range of motion.  Skin:    General: Skin is warm.     Capillary Refill: Capillary refill takes less than 2 seconds.  Neurological:     Mental Status: She is alert and oriented to person, place, and time.  Psychiatric:        Mood and Affect: Mood normal.        Behavior: Behavior normal.        Thought Content: Thought content normal.        Judgment: Judgment normal.     Assessment & Plan:  Lumbar back pain  S/P gastric bypass  Panniculitis  The patient is a candidate for panniculectomy.  I explained the difference between a panniculectomy and abdominoplasty.  The patient acknowledged understanding that the upper abdomen would not be affected and a beneficial way.  Only the pannus is removed and a panniculectomy.  The patient would like to move ahead with surgery.  I  would like her to drop 10 pounds prior to the surgery so we can plan on late February or early March.  Pictures were obtained of the patient and placed in the chart with the patient's or guardian's permission.   [place], DO

## 2022-04-16 ENCOUNTER — Institutional Professional Consult (permissible substitution): Payer: 59 | Admitting: Plastic Surgery

## 2022-05-16 NOTE — H&P (View-Only) (Signed)
   Patient ID: Caitlin Wood, female    DOB: 12/30/1980, 41 y.o.   MRN: [id number]  Chief Complaint  Patient presents with   Pre-op Exam      ICD-10-CM   1. Panniculitis  M79.3        History of Present Illness: Caitlin Wood is a 41 y.o.  female  with a history of lumbar back discomfort and recurrent panniculitis.  She presents for preoperative evaluation for upcoming procedure, panniculectomy with possible liposuction, scheduled for 06/06/2022 with Dr. Dillingham.  The patient has not had problems with anesthesia with her previous surgeries, yet it always remains a concern for her.  She is otherwise excited for her upcoming surgery.  She denies any personal history of cardiac or pulmonary disease.  No personal history of cancer or use of blood thinners.  No personal or family history of blood clots or clotting disorder.  She is followed by pain management for chronic back pain which she attributes to arthritis.  She endorses 80 pound weight loss since her gastric bypass.  She had previously diagnosed OSA, but no longer using CPAP.  She will hold her multivitamins/supplements 1 week prior to surgery.  Summary of Previous Visit: She was last seen for consult here in office on 03/20/2022 by Dr. Dillingham.  At that time, she stated that she uses nystatin creams and different kinds of powders in order to minimize her recurrent rashes and infra pannus fold.  Surgical history notable for C-section, hysterectomy, and bariatric surgery.  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  PMH Significant for: Recurrent panniculitis, bariatric surgery, C-section, hysterectomy, MDD, previously diagnosed OSA no longer on CPAP, chronic pain on oxycodone 7.5-325 mg 4 times daily.   Past Medical History: Allergies: Allergies  Allergen Reactions   Codeine Nausea And Vomiting    Current Medications:  Current Outpatient Medications:    Calcium  Carb-Cholecalciferol (CALCIUM HIGH POTENCY/VITAMIN D) 600-5 MG-MCG TABS, Take by mouth., Disp: , Rfl:    ferrous sulfate 325 (65 FE) MG EC tablet, Take by mouth., Disp: , Rfl:    LINZESS 290 MCG CAPS capsule, Take 290 mcg by mouth daily., Disp: , Rfl:    Multiple Vitamin (MULTI-VITAMIN) tablet, Take 1 tablet by mouth daily., Disp: , Rfl:    NASCOBAL 500 MCG/0.1ML SOLN, Place into both nostrils once a week., Disp: , Rfl:    NYSTATIN powder, Apply topically 2 (two) times daily., Disp: , Rfl:    oxyCODONE-acetaminophen (PERCOCET) 7.5-325 MG tablet, Take 1 tablet by mouth 4 (four) times daily as needed., Disp: , Rfl:    cephALEXin (KEFLEX) 500 MG capsule, Take 1 capsule (500 mg total) by mouth 4 (four) times daily for 3 days., Disp: 12 capsule, Rfl: 0   ondansetron (ZOFRAN-ODT) 4 MG disintegrating tablet, Take 1 tablet (4 mg total) by mouth every 8 (eight) hours as needed for nausea or vomiting., Disp: 20 tablet, Rfl: 0  Past Medical Problems: Past Medical History:  Diagnosis Date   Obese     Past Surgical History: Past Surgical History:  Procedure Laterality Date   CESAREAN SECTION  1999   x 1   GASTRIC ROUX-EN-Y N/A 05/28/2016   Procedure: LAPAROSCOPIC ROUX-EN-Y GASTRIC BYPASS WITH UPPER ENDOSCOPY;  Surgeon: Eric Wilson, MD;  Location: [facility];  Service: General;  Laterality: N/A;   None listed     PARTIAL HYSTERECTOMY  2001   TUBAL LIGATION     UPPER   GI ENDOSCOPY  05/28/2016   Procedure: UPPER GI ENDOSCOPY;  Surgeon: Eric Wilson, MD;  Location: [facility];  Service: General;;    Social History: Social History   Socioeconomic History   Marital status: Married    Spouse name: Michael    Number of children: 2   Years of education: 16   Highest education level: Not on file  Occupational History    Comment: [employer]  Tobacco Use   Smoking status: Never   Smokeless tobacco: Never  Substance and Sexual Activity   Alcohol use: No   Drug use: No   Sexual activity: Not on file  Other  Topics Concern   Not on file  Social History Narrative   Patient lives at home with Michael.    Patient has 2 children.    Patient is right handed.    Patient has a college education.    Caffeine seldom, maybe a soda   Social Determinants of Health   Financial Resource Strain: Not on file  Food Insecurity: Not on file  Transportation Needs: Not on file  Physical Activity: Not on file  Stress: Not on file  Social Connections: Not on file  Intimate Partner Violence: Not on file    Family History: No family history on file.  Review of Systems: ROS Denies any recent chest pain, difficulty breathing, leg swelling, or fevers.  Physical Exam: Vital Signs BP 116/79 (BP Location: Left Arm, Patient Position: Sitting, Cuff Size: Large)   Pulse 77   Ht 5' 5" (1.651 m)   Wt 212 lb 9.6 oz (96.4 kg)   SpO2 99%   BMI 35.38 kg/m   Physical Exam Constitutional:      General: Not in acute distress.    Appearance: Normal appearance. Not ill-appearing.  HENT:     Head: Normocephalic and atraumatic.  Eyes:     Pupils: Pupils are equal, round. Cardiovascular:     Rate and Rhythm: Normal rate.    Pulses: Normal pulses.  Pulmonary:     Effort: No respiratory distress or increased work of breathing.  Speaks in full sentences. Abdominal:     General: Abdomen is flat. No distension.   Musculoskeletal: Normal range of motion. No lower extremity swelling or edema. No varicosities. Skin:    General: Skin is warm and dry.     Findings: No erythema or rash.  Neurological:     Mental Status: Alert and oriented to person, place, and time.  Psychiatric:        Mood and Affect: Mood normal.        Behavior: Behavior normal.    Assessment/Plan: The patient is scheduled for panniculectomy with possible liposuction with Dr. Dillingham.  Risks, benefits, and alternatives of procedure discussed, questions answered and consent obtained.    Smoking Status: Non-smoker.  Caprini Score: 4; Risk  Factors include: Age, BMI greater than 25, and length of planned surgery. Recommendation for mechanical prophylaxis. Encourage early ambulation.   Pictures obtained: 03/20/2022  Post-op Rx sent to pharmacy: Keflex and Zofran.  She has ample oxycodone at home and states that she only takes it as needed.  Patient was provided with the General Surgical Risk consent document and Pain Medication Agreement prior to their appointment.  They had adequate time to read through the risk consent documents and Pain Medication Agreement. We also discussed them in person together during this preop appointment. All of their questions were answered to their satisfaction.  Recommended calling if they have any   further questions.  Risk consent form and Pain Medication Agreement to be scanned into patient's chart.  The risk that can be encountered for this procedure were discussed and include the following but not limited to these: asymmetry, fluid accumulation, firmness of the tissue, skin loss, decrease or no sensation, fat necrosis, bleeding, infection, healing delay.  Deep vein thrombosis, cardiac and pulmonary complications are risks to any procedure.  There are risks of anesthesia, changes to skin sensation and injury to nerves or blood vessels.  The muscle can be temporarily or permanently injured.  You may have an allergic reaction to tape, suture, glue, blood products which can result in skin discoloration, swelling, pain, skin lesions, poor healing.  Any of these can lead to the need for revisonal surgery or stage procedures.  Weight gain and weigh loss can also effect the long term appearance. The results are not guaranteed to last a lifetime.  Future surgery may be required.    The risks that can be encountered with and after liposuction were discussed and include the following but no limited to these:  Asymmetry, fluid accumulation, firmness of the area, fat necrosis with death of fat tissue, bleeding, infection,  delayed healing, anesthesia risks, skin sensation changes, injury to structures including nerves, blood vessels, and muscles which may be temporary or permanent, allergies to tape, suture materials and glues, blood products, topical preparations or injected agents, skin and contour irregularities, skin discoloration and swelling, deep vein thrombosis, cardiac and pulmonary complications, pain, which may persist, persistent pain, recurrence of the lesion, poor healing of the incision, possible need for revisional surgery or staged procedures. Thiere can also be persistent swelling, poor wound healing, rippling or loose skin, worsening of cellulite, swelling, and thermal burn or heat injury from ultrasound with the ultrasound-assisted lipoplasty technique. Any change in weight fluctuations can alter the outcome.    Electronically signed by: Irish Piech, PA-C 05/17/2022 8:48 AM 

## 2022-05-16 NOTE — Progress Notes (Signed)
Patient ID: Caitlin Wood, female    DOB: January 20, 1981, 42 y.o.   MRN: [id number]  Chief Complaint  Patient presents with   Pre-op Exam      ICD-10-CM   1. Panniculitis  M79.3        History of Present Illness: Caitlin Wood is a 42 y.o.  female  with a history of lumbar back discomfort and recurrent panniculitis.  She presents for preoperative evaluation for upcoming procedure, panniculectomy with possible liposuction, scheduled for 06/06/2022 with Dr. Marla Roe.  The patient has not had problems with anesthesia with her previous surgeries, yet it always remains a concern for her.  She is otherwise excited for her upcoming surgery.  She denies any personal history of cardiac or pulmonary disease.  No personal history of cancer or use of blood thinners.  No personal or family history of blood clots or clotting disorder.  She is followed by pain management for chronic back pain which she attributes to arthritis.  She endorses 80 pound weight loss since her gastric bypass.  She had previously diagnosed OSA, but no longer using CPAP.  She will hold her multivitamins/supplements 1 week prior to surgery.  Summary of Previous Visit: She was last seen for consult here in office on 03/20/2022 by Dr. Marla Roe.  At that time, she stated that she uses nystatin creams and different kinds of powders in order to minimize her recurrent rashes and infra pannus fold.  Surgical history notable for C-section, hysterectomy, and bariatric surgery.  Job: Transport planner, states that she does not require any FMLA or STD given ample PTO.  PMH Significant for: Recurrent panniculitis, bariatric surgery, C-section, hysterectomy, MDD, previously diagnosed OSA no longer on CPAP, chronic pain on oxycodone 7.5-325 mg 4 times daily.   Past Medical History: Allergies: Allergies  Allergen Reactions   Codeine Nausea And Vomiting    Current Medications:  Current Outpatient Medications:    Calcium  Carb-Cholecalciferol (CALCIUM HIGH POTENCY/VITAMIN D) 600-5 MG-MCG TABS, Take by mouth., Disp: , Rfl:    ferrous sulfate 325 (65 FE) MG EC tablet, Take by mouth., Disp: , Rfl:    LINZESS 290 MCG CAPS capsule, Take 290 mcg by mouth daily., Disp: , Rfl:    Multiple Vitamin (MULTI-VITAMIN) tablet, Take 1 tablet by mouth daily., Disp: , Rfl:    NASCOBAL 500 MCG/0.1ML SOLN, Place into both nostrils once a week., Disp: , Rfl:    NYSTATIN powder, Apply topically 2 (two) times daily., Disp: , Rfl:    oxyCODONE-acetaminophen (PERCOCET) 7.5-325 MG tablet, Take 1 tablet by mouth 4 (four) times daily as needed., Disp: , Rfl:    cephALEXin (KEFLEX) 500 MG capsule, Take 1 capsule (500 mg total) by mouth 4 (four) times daily for 3 days., Disp: 12 capsule, Rfl: 0   ondansetron (ZOFRAN-ODT) 4 MG disintegrating tablet, Take 1 tablet (4 mg total) by mouth every 8 (eight) hours as needed for nausea or vomiting., Disp: 20 tablet, Rfl: 0  Past Medical Problems: Past Medical History:  Diagnosis Date   Obese     Past Surgical History: Past Surgical History:  Procedure Laterality Date   CESAREAN SECTION  1999   x 1   GASTRIC ROUX-EN-Y N/A 05/28/2016   Procedure: LAPAROSCOPIC ROUX-EN-Y GASTRIC BYPASS WITH UPPER ENDOSCOPY;  Surgeon: Greer Pickerel, MD;  Location: [facility];  Service: General;  Laterality: N/A;   None listed     PARTIAL HYSTERECTOMY  2001   TUBAL LIGATION     UPPER  GI ENDOSCOPY  05/28/2016   Procedure: UPPER GI ENDOSCOPY;  Surgeon: Greer Pickerel, MD;  Location: [facility];  Service: General;;    Social History: Social History   Socioeconomic History   Marital status: Married    Spouse name: Legrand Como    Number of children: 2   Years of education: 16   Highest education level: Not on file  Occupational History    Comment: [employer]  Tobacco Use   Smoking status: Never   Smokeless tobacco: Never  Substance and Sexual Activity   Alcohol use: No   Drug use: No   Sexual activity: Not on file  Other  Topics Concern   Not on file  Social History Narrative   Patient lives at home with Legrand Como.    Patient has 2 children.    Patient is right handed.    Patient has a college education.    Caffeine seldom, maybe a soda   Social Determinants of Health   Financial Resource Strain: Not on file  Food Insecurity: Not on file  Transportation Needs: Not on file  Physical Activity: Not on file  Stress: Not on file  Social Connections: Not on file  Intimate Partner Violence: Not on file    Family History: No family history on file.  Review of Systems: ROS Denies any recent chest pain, difficulty breathing, leg swelling, or fevers.  Physical Exam: Vital Signs BP 116/79 (BP Location: Left Arm, Patient Position: Sitting, Cuff Size: Large)   Pulse 77   Ht '5\' 5"'$  (1.651 m)   Wt 212 lb 9.6 oz (96.4 kg)   SpO2 99%   BMI 35.38 kg/m   Physical Exam Constitutional:      General: Not in acute distress.    Appearance: Normal appearance. Not ill-appearing.  HENT:     Head: Normocephalic and atraumatic.  Eyes:     Pupils: Pupils are equal, round. Cardiovascular:     Rate and Rhythm: Normal rate.    Pulses: Normal pulses.  Pulmonary:     Effort: No respiratory distress or increased work of breathing.  Speaks in full sentences. Abdominal:     General: Abdomen is flat. No distension.   Musculoskeletal: Normal range of motion. No lower extremity swelling or edema. No varicosities. Skin:    General: Skin is warm and dry.     Findings: No erythema or rash.  Neurological:     Mental Status: Alert and oriented to person, place, and time.  Psychiatric:        Mood and Affect: Mood normal.        Behavior: Behavior normal.    Assessment/Plan: The patient is scheduled for panniculectomy with possible liposuction with Dr. Marla Roe.  Risks, benefits, and alternatives of procedure discussed, questions answered and consent obtained.    Smoking Status: Non-smoker.  Caprini Score: 4; Risk  Factors include: Age, BMI greater than 25, and length of planned surgery. Recommendation for mechanical prophylaxis. Encourage early ambulation.   Pictures obtained: 03/20/2022  Post-op Rx sent to pharmacy: Keflex and Zofran.  She has ample oxycodone at home and states that she only takes it as needed.  Patient was provided with the General Surgical Risk consent document and Pain Medication Agreement prior to their appointment.  They had adequate time to read through the risk consent documents and Pain Medication Agreement. We also discussed them in person together during this preop appointment. All of their questions were answered to their satisfaction.  Recommended calling if they have any  further questions.  Risk consent form and Pain Medication Agreement to be scanned into patient's chart.  The risk that can be encountered for this procedure were discussed and include the following but not limited to these: asymmetry, fluid accumulation, firmness of the tissue, skin loss, decrease or no sensation, fat necrosis, bleeding, infection, healing delay.  Deep vein thrombosis, cardiac and pulmonary complications are risks to any procedure.  There are risks of anesthesia, changes to skin sensation and injury to nerves or blood vessels.  The muscle can be temporarily or permanently injured.  You may have an allergic reaction to tape, suture, glue, blood products which can result in skin discoloration, swelling, pain, skin lesions, poor healing.  Any of these can lead to the need for revisonal surgery or stage procedures.  Weight gain and weigh loss can also effect the long term appearance. The results are not guaranteed to last a lifetime.  Future surgery may be required.    The risks that can be encountered with and after liposuction were discussed and include the following but no limited to these:  Asymmetry, fluid accumulation, firmness of the area, fat necrosis with death of fat tissue, bleeding, infection,  delayed healing, anesthesia risks, skin sensation changes, injury to structures including nerves, blood vessels, and muscles which may be temporary or permanent, allergies to tape, suture materials and glues, blood products, topical preparations or injected agents, skin and contour irregularities, skin discoloration and swelling, deep vein thrombosis, cardiac and pulmonary complications, pain, which may persist, persistent pain, recurrence of the lesion, poor healing of the incision, possible need for revisional surgery or staged procedures. Thiere can also be persistent swelling, poor wound healing, rippling or loose skin, worsening of cellulite, swelling, and thermal burn or heat injury from ultrasound with the ultrasound-assisted lipoplasty technique. Any change in weight fluctuations can alter the outcome.    Electronically signed by: Krista Blue, PA-C 05/17/2022 8:48 AM

## 2022-05-17 ENCOUNTER — Ambulatory Visit: Payer: Managed Care, Other (non HMO) | Admitting: Physician Assistant

## 2022-05-17 VITALS — BP 116/79 | HR 77 | Ht 65.0 in | Wt 212.6 lb

## 2022-05-17 DIAGNOSIS — M793 Panniculitis, unspecified: Secondary | ICD-10-CM

## 2022-05-17 MED ORDER — ONDANSETRON 4 MG PO TBDP: 4.0000 mg | ORAL_TABLET | Freq: Three times a day (TID) | ORAL | 0 refills | Status: AC | PRN

## 2022-05-17 MED ORDER — CEPHALEXIN 500 MG PO CAPS: 500.0000 mg | ORAL_CAPSULE | Freq: Four times a day (QID) | ORAL | 0 refills | Status: AC

## 2022-05-30 ENCOUNTER — Other Ambulatory Visit: Payer: Self-pay

## 2022-05-30 ENCOUNTER — Encounter (HOSPITAL_BASED_OUTPATIENT_CLINIC_OR_DEPARTMENT_OTHER): Payer: Self-pay | Admitting: Plastic Surgery

## 2022-06-06 ENCOUNTER — Other Ambulatory Visit: Payer: Self-pay

## 2022-06-06 ENCOUNTER — Ambulatory Visit (HOSPITAL_BASED_OUTPATIENT_CLINIC_OR_DEPARTMENT_OTHER): Payer: Managed Care, Other (non HMO) | Admitting: Certified Registered Nurse Anesthetist

## 2022-06-06 ENCOUNTER — Encounter (HOSPITAL_BASED_OUTPATIENT_CLINIC_OR_DEPARTMENT_OTHER): Payer: Self-pay | Admitting: Plastic Surgery

## 2022-06-06 ENCOUNTER — Ambulatory Visit (HOSPITAL_BASED_OUTPATIENT_CLINIC_OR_DEPARTMENT_OTHER)
Admission: RE | Admit: 2022-06-06 | Discharge: 2022-06-06 | Disposition: A | Discharge status: Home / Self Care | Payer: Managed Care, Other (non HMO) | Attending: Plastic Surgery | Admitting: Plastic Surgery

## 2022-06-06 ENCOUNTER — Encounter (HOSPITAL_BASED_OUTPATIENT_CLINIC_OR_DEPARTMENT_OTHER)
Admission: RE | Disposition: A | Discharge status: Home / Self Care | Payer: Self-pay | Source: Home / Self Care | Attending: Plastic Surgery

## 2022-06-06 DIAGNOSIS — F32A Depression, unspecified: Secondary | ICD-10-CM | POA: Insufficient documentation

## 2022-06-06 DIAGNOSIS — Z6835 Body mass index (BMI) 35.0-35.9, adult: Secondary | ICD-10-CM | POA: Diagnosis not present

## 2022-06-06 DIAGNOSIS — M793 Panniculitis, unspecified: Secondary | ICD-10-CM

## 2022-06-06 DIAGNOSIS — G4730 Sleep apnea, unspecified: Secondary | ICD-10-CM | POA: Insufficient documentation

## 2022-06-06 DIAGNOSIS — Z01818 Encounter for other preprocedural examination: Secondary | ICD-10-CM

## 2022-06-06 DIAGNOSIS — E669 Obesity, unspecified: Secondary | ICD-10-CM | POA: Insufficient documentation

## 2022-06-06 HISTORY — DX: Sleep apnea, unspecified: G47.30

## 2022-06-06 HISTORY — PX: ABDOMINOPLASTY/PANNICULECTOMY WITH LIPOSUCTION: SHX5577

## 2022-06-06 SURGERY — PANNICULECTOMY, ABDOMINAL, WITH LIPOSUCTION
Anesthesia: General | Site: Abdomen | Laterality: Bilateral

## 2022-06-06 MED ORDER — OXYCODONE HCL 5 MG PO TABS
ORAL_TABLET | ORAL | Status: AC
  Filled 2022-06-06: qty 1

## 2022-06-06 MED ORDER — ATROPINE SULFATE 0.4 MG/ML IV SOLN
INTRAVENOUS | Status: AC
  Filled 2022-06-06: qty 1

## 2022-06-06 MED ORDER — ACETAMINOPHEN 160 MG/5ML PO SOLN: 325.0000 mg | ORAL | Status: DC | PRN

## 2022-06-06 MED ORDER — SUCCINYLCHOLINE CHLORIDE 200 MG/10ML IV SOSY
PREFILLED_SYRINGE | INTRAVENOUS | Status: AC
  Filled 2022-06-06: qty 10

## 2022-06-06 MED ORDER — PHENYLEPHRINE 80 MCG/ML (10ML) SYRINGE FOR IV PUSH (FOR BLOOD PRESSURE SUPPORT)
PREFILLED_SYRINGE | INTRAVENOUS | Status: AC
  Filled 2022-06-06: qty 10

## 2022-06-06 MED ORDER — ACETAMINOPHEN 325 MG PO TABS: 650.0000 mg | ORAL_TABLET | ORAL | Status: DC | PRN

## 2022-06-06 MED ORDER — ALBUMIN HUMAN 25 % IV SOLN: INTRAVENOUS | Status: DC | PRN

## 2022-06-06 MED ORDER — PROPOFOL 10 MG/ML IV BOLUS
INTRAVENOUS | Status: DC | PRN
  Administered 2022-06-06: 150 mg via INTRAVENOUS

## 2022-06-06 MED ORDER — SODIUM CHLORIDE 0.9 % IV SOLN
INTRAVENOUS | Status: DC | PRN
  Administered 2022-06-06: 40 mL

## 2022-06-06 MED ORDER — FENTANYL CITRATE (PF) 100 MCG/2ML IJ SOLN
INTRAMUSCULAR | Status: AC
  Filled 2022-06-06: qty 2

## 2022-06-06 MED ORDER — ACETAMINOPHEN 325 MG RE SUPP: 650.0000 mg | RECTAL | Status: DC | PRN

## 2022-06-06 MED ORDER — OXYCODONE HCL 5 MG PO TABS
5.0000 mg | ORAL_TABLET | Freq: Once | ORAL | Status: AC | PRN
  Administered 2022-06-06: 5 mg via ORAL

## 2022-06-06 MED ORDER — 0.9 % SODIUM CHLORIDE (POUR BTL) OPTIME
TOPICAL | Status: DC | PRN
  Administered 2022-06-06: 1000 mL

## 2022-06-06 MED ORDER — PROMETHAZINE HCL 25 MG/ML IJ SOLN: 6.2500 mg | INTRAMUSCULAR | Status: DC | PRN

## 2022-06-06 MED ORDER — LIDOCAINE HCL (CARDIAC) PF 100 MG/5ML IV SOSY
PREFILLED_SYRINGE | INTRAVENOUS | Status: DC | PRN
  Administered 2022-06-06: 80 mg via INTRAVENOUS

## 2022-06-06 MED ORDER — ONDANSETRON HCL 4 MG/2ML IJ SOLN
INTRAMUSCULAR | Status: AC
  Filled 2022-06-06: qty 2

## 2022-06-06 MED ORDER — ACETAMINOPHEN 325 MG PO TABS: 325.0000 mg | ORAL_TABLET | ORAL | Status: DC | PRN

## 2022-06-06 MED ORDER — SODIUM CHLORIDE 0.9% FLUSH: 3.0000 mL | INTRAVENOUS | Status: DC | PRN

## 2022-06-06 MED ORDER — ROCURONIUM BROMIDE 100 MG/10ML IV SOLN
INTRAVENOUS | Status: DC | PRN
  Administered 2022-06-06: 70 mg via INTRAVENOUS

## 2022-06-06 MED ORDER — SCOPOLAMINE 1 MG/3DAYS TD PT72
MEDICATED_PATCH | TRANSDERMAL | Status: AC
  Filled 2022-06-06: qty 1

## 2022-06-06 MED ORDER — ACETAMINOPHEN 10 MG/ML IV SOLN: 1000.0000 mg | Freq: Once | INTRAVENOUS | Status: DC | PRN

## 2022-06-06 MED ORDER — LIDOCAINE 2% (20 MG/ML) 5 ML SYRINGE
INTRAMUSCULAR | Status: AC
  Filled 2022-06-06: qty 5

## 2022-06-06 MED ORDER — ACETAMINOPHEN 500 MG PO TABS: 1000.0000 mg | ORAL_TABLET | Freq: Once | ORAL | Status: DC

## 2022-06-06 MED ORDER — FENTANYL CITRATE (PF) 100 MCG/2ML IJ SOLN: 25.0000 ug | INTRAMUSCULAR | Status: DC | PRN

## 2022-06-06 MED ORDER — ONDANSETRON HCL 4 MG/2ML IJ SOLN
INTRAMUSCULAR | Status: DC | PRN
  Administered 2022-06-06: 4 mg via INTRAVENOUS

## 2022-06-06 MED ORDER — MIDAZOLAM HCL 5 MG/5ML IJ SOLN
INTRAMUSCULAR | Status: DC | PRN
  Administered 2022-06-06: 2 mg via INTRAVENOUS

## 2022-06-06 MED ORDER — DEXAMETHASONE SODIUM PHOSPHATE 10 MG/ML IJ SOLN
INTRAMUSCULAR | Status: AC
  Filled 2022-06-06: qty 1

## 2022-06-06 MED ORDER — CEFAZOLIN SODIUM-DEXTROSE 2-4 GM/100ML-% IV SOLN
2.0000 g | INTRAVENOUS | Status: AC
  Administered 2022-06-06: 2 g via INTRAVENOUS

## 2022-06-06 MED ORDER — SODIUM CHLORIDE (PF) 0.9 % IJ SOLN
INTRAMUSCULAR | Status: DC | PRN
  Administered 2022-06-06: 20 mL

## 2022-06-06 MED ORDER — CHLORHEXIDINE GLUCONATE CLOTH 2 % EX PADS: 6.0000 | MEDICATED_PAD | Freq: Once | CUTANEOUS | Status: DC

## 2022-06-06 MED ORDER — PHENYLEPHRINE HCL (PRESSORS) 10 MG/ML IV SOLN
INTRAVENOUS | Status: DC | PRN
  Administered 2022-06-06: 160 ug via INTRAVENOUS

## 2022-06-06 MED ORDER — LIDOCAINE HCL 1 % IJ SOLN
INTRAVENOUS | Status: DC | PRN
  Administered 2022-06-06: 1000 mL

## 2022-06-06 MED ORDER — CEFAZOLIN SODIUM-DEXTROSE 2-4 GM/100ML-% IV SOLN
INTRAVENOUS | Status: AC
  Filled 2022-06-06: qty 100

## 2022-06-06 MED ORDER — DEXAMETHASONE SODIUM PHOSPHATE 4 MG/ML IJ SOLN
INTRAMUSCULAR | Status: DC | PRN
  Administered 2022-06-06: 5 mg via INTRAVENOUS

## 2022-06-06 MED ORDER — LIDOCAINE-EPINEPHRINE 1 %-1:100000 IJ SOLN
INTRAMUSCULAR | Status: DC | PRN
  Administered 2022-06-06: 40 mL

## 2022-06-06 MED ORDER — OXYCODONE HCL 5 MG PO TABS: 5.0000 mg | ORAL_TABLET | ORAL | Status: DC | PRN

## 2022-06-06 MED ORDER — SUGAMMADEX SODIUM 200 MG/2ML IV SOLN
INTRAVENOUS | Status: DC | PRN
  Administered 2022-06-06: 200 mg via INTRAVENOUS

## 2022-06-06 MED ORDER — SODIUM CHLORIDE 0.9% FLUSH: 3.0000 mL | Freq: Two times a day (BID) | INTRAVENOUS | Status: DC

## 2022-06-06 MED ORDER — BUPIVACAINE LIPOSOME 1.3 % IJ SUSP
INTRAMUSCULAR | Status: AC
  Filled 2022-06-06: qty 20

## 2022-06-06 MED ORDER — EPHEDRINE 5 MG/ML INJ
INTRAVENOUS | Status: AC
  Filled 2022-06-06: qty 5

## 2022-06-06 MED ORDER — OXYCODONE HCL 5 MG/5ML PO SOLN: 5.0000 mg | Freq: Once | ORAL | Status: AC | PRN

## 2022-06-06 MED ORDER — MIDAZOLAM HCL 2 MG/2ML IJ SOLN
INTRAMUSCULAR | Status: AC
  Filled 2022-06-06: qty 2

## 2022-06-06 MED ORDER — SODIUM CHLORIDE 0.9 % IV SOLN: 250.0000 mL | INTRAVENOUS | Status: DC | PRN

## 2022-06-06 MED ORDER — AMISULPRIDE (ANTIEMETIC) 5 MG/2ML IV SOLN: 10.0000 mg | Freq: Once | INTRAVENOUS | Status: DC | PRN

## 2022-06-06 MED ORDER — LACTATED RINGERS IV SOLN: INTRAVENOUS | Status: DC

## 2022-06-06 MED ORDER — FENTANYL CITRATE (PF) 100 MCG/2ML IJ SOLN
INTRAMUSCULAR | Status: DC | PRN
  Administered 2022-06-06 (×2): 25 ug via INTRAVENOUS
  Administered 2022-06-06: 100 ug via INTRAVENOUS
  Administered 2022-06-06: 50 ug via INTRAVENOUS

## 2022-06-06 MED ORDER — SCOPOLAMINE 1 MG/3DAYS TD PT72
MEDICATED_PATCH | TRANSDERMAL | Status: DC | PRN
  Administered 2022-06-06: 1 via TRANSDERMAL

## 2022-06-06 MED ORDER — ROCURONIUM BROMIDE 10 MG/ML (PF) SYRINGE
PREFILLED_SYRINGE | INTRAVENOUS | Status: AC
  Filled 2022-06-06: qty 10

## 2022-06-06 SURGICAL SUPPLY — 66 items
ADH SKN CLS APL DERMABOND .7 (GAUZE/BANDAGES/DRESSINGS) ×2
AGENT HMST PWDR BTL CLGN 5GM (Miscellaneous) ×1 IMPLANT
APPLIER CLIP 9.375 MED OPEN (MISCELLANEOUS)
APR CLP MED 9.3 20 MLT OPN (MISCELLANEOUS)
BAG DECANTER FOR FLEXI CONT (MISCELLANEOUS) IMPLANT
BINDER ABDOMINAL 10 UNV 27-48 (MISCELLANEOUS) IMPLANT
BINDER ABDOMINAL 12 SM 30-45 (SOFTGOODS) IMPLANT
BIOPATCH RED 1 DISK 7.0 (GAUZE/BANDAGES/DRESSINGS) IMPLANT
BLADE CLIPPER SURG (BLADE) ×1 IMPLANT
BLADE HEX COATED 2.75 (ELECTRODE) ×1 IMPLANT
BLADE SURG 10 STRL SS (BLADE) ×2 IMPLANT
BLADE SURG 15 STRL LF DISP TIS (BLADE) ×1 IMPLANT
BLADE SURG 15 STRL SS (BLADE) ×1
CANISTER SUCT 1200ML W/VALVE (MISCELLANEOUS) ×1 IMPLANT
CLIP APPLIE 9.375 MED OPEN (MISCELLANEOUS) IMPLANT
COLLAGEN CELLERATERX 5 GRAM (Miscellaneous) IMPLANT
COVER BACK TABLE 60X90IN (DRAPES) ×1 IMPLANT
COVER MAYO STAND STRL (DRAPES) ×1 IMPLANT
DERMABOND ADVANCED .7 DNX12 (GAUZE/BANDAGES/DRESSINGS) ×2 IMPLANT
DRAIN CHANNEL 19F RND (DRAIN) IMPLANT
DRAPE LAPAROSCOPIC ABDOMINAL (DRAPES) ×1 IMPLANT
DRSG MEPILEX POST OP 4X8 (GAUZE/BANDAGES/DRESSINGS) ×2 IMPLANT
DRSG TEGADERM 2-3/8X2-3/4 SM (GAUZE/BANDAGES/DRESSINGS) ×2 IMPLANT
ELECT BLADE 4.0 EZ CLEAN MEGAD (MISCELLANEOUS) ×1
ELECT REM PT RETURN 9FT ADLT (ELECTROSURGICAL) ×1
ELECTRODE BLDE 4.0 EZ CLN MEGD (MISCELLANEOUS) ×1 IMPLANT
ELECTRODE REM PT RTRN 9FT ADLT (ELECTROSURGICAL) ×1 IMPLANT
EVACUATOR SILICONE 100CC (DRAIN) IMPLANT
GAUZE PAD ABD 8X10 STRL (GAUZE/BANDAGES/DRESSINGS) ×2 IMPLANT
GAUZE SPONGE 4X4 12PLY STRL (GAUZE/BANDAGES/DRESSINGS) IMPLANT
GLOVE BIO SURGEON STRL SZ 6.5 (GLOVE) ×2 IMPLANT
GLOVE BIOGEL PI IND STRL 7.0 (GLOVE) ×1 IMPLANT
GOWN STRL REUS W/ TWL LRG LVL3 (GOWN DISPOSABLE) ×2 IMPLANT
GOWN STRL REUS W/TWL LRG LVL3 (GOWN DISPOSABLE) ×3
HEMOSTAT ARISTA ABSORB 3G PWDR (HEMOSTASIS) IMPLANT
LINER CANISTER 1000CC FLEX (MISCELLANEOUS) IMPLANT
NDL HYPO 25X1 1.5 SAFETY (NEEDLE) ×1 IMPLANT
NDL SAFETY ECLIP 18X1.5 (MISCELLANEOUS) IMPLANT
NEEDLE HYPO 25X1 1.5 SAFETY (NEEDLE) ×2 IMPLANT
NS IRRIG 1000ML POUR BTL (IV SOLUTION) ×1 IMPLANT
PACK BASIN DAY SURGERY FS (CUSTOM PROCEDURE TRAY) ×1 IMPLANT
PAD FOAM SILICONE BACKED (GAUZE/BANDAGES/DRESSINGS) ×1 IMPLANT
PENCIL SMOKE EVACUATOR (MISCELLANEOUS) ×1 IMPLANT
PIN SAFETY STERILE (MISCELLANEOUS) IMPLANT
SLEEVE SCD COMPRESS KNEE MED (STOCKING) ×1 IMPLANT
SPONGE T-LAP 18X18 ~~LOC~~+RFID (SPONGE) ×2 IMPLANT
STRIP SUTURE WOUND CLOSURE 1/2 (MISCELLANEOUS) ×2 IMPLANT
SUT MNCRL AB 4-0 PS2 18 (SUTURE) ×2 IMPLANT
SUT MON AB 3-0 SH 27 (SUTURE) ×4
SUT MON AB 3-0 SH27 (SUTURE) ×2 IMPLANT
SUT MON AB 5-0 PS2 18 (SUTURE) IMPLANT
SUT PDS 3-0 CT2 (SUTURE) ×5
SUT PDS II 3-0 CT2 27 ABS (SUTURE) ×5 IMPLANT
SUT SILK 2 0 SH (SUTURE) IMPLANT
SUT VICRYL 4-0 PS2 18IN ABS (SUTURE) ×2 IMPLANT
SYR 50ML LL SCALE MARK (SYRINGE) IMPLANT
SYR BULB IRRIG 60ML STRL (SYRINGE) ×1 IMPLANT
SYR CONTROL 10ML LL (SYRINGE) ×1 IMPLANT
SYR TB 1ML LL NO SAFETY (SYRINGE) IMPLANT
TOWEL GREEN STERILE FF (TOWEL DISPOSABLE) ×2 IMPLANT
TRAY DSU PREP LF (CUSTOM PROCEDURE TRAY) ×1 IMPLANT
TUBE CONNECTING 20X1/4 (TUBING) ×1 IMPLANT
TUBING INFILTRATION IT-10001 (TUBING) IMPLANT
TUBING SET GRADUATE ASPIR 12FT (MISCELLANEOUS) IMPLANT
UNDERPAD 30X36 HEAVY ABSORB (UNDERPADS AND DIAPERS) ×2 IMPLANT
YANKAUER SUCT BULB TIP NO VENT (SUCTIONS) ×1 IMPLANT

## 2022-06-06 NOTE — Op Note (Signed)
Operative Report  Date of operation: 06/06/2022  Patient: Caitlin Wood, MRN: [id number], 42 y.o. female.   Date of birth: 03/06/1981  Location: [facility]  Preoperative Diagnosis: Panniculitis  Postoperative Diagnosis:  Same  Procedure: Panniculectomy  Surgeon:  Lyndee Leo Shirleyann Montero  Assistant: Donnamarie Rossetti, PA  Anesthesia:  General  EBL:  100cc  Drains:  2 number 77 blake round drains  Condition:  Stable  Complications: None  Disposition: Recovery Room  Procedure in Detail: Patient was seen the morning of her surgery and marked out for the procedure. She was then given an IV and IV antibiotics. The patient was taken to the operating room and underwent general anesthesia.  A time out was called and all information was confirmed to be correct. SCD's and a pillow under the knees was in place. The patient was then prepped and draped in the standard sterile fashion. Local was placed into the incision and 2 stab incisions made through which 200 cc of tumescent was placed in each flank area. The flanks were liposuctioned and 400 cc removed from the sides. The planned lower incision was then incised and the incision taken down through the Scarpa's fascia to the rectus abdominus fascia. The skin and subcutaneous tissue was then lifted off the fascia up to the level of the umbilicus. The umbilicus is then circumscribed and freed up from the surrounding skin and freed down to the abdominal wall. The abdominal wall was then freed to the umbilicus.  The patient was then flexed on the table and the amount that could be excised was confirmed. The pannus was excised and it weighed 1922 grams.  The mons was also suspended with 3-0 Monocryl.  The wound was irrigated with normal saline solution. Two #19 blake round drains were placed and secured with 3-0 Silk. The patient was put back in the flexed position and the closure begun. The Cellerate and the Experel were placed. The  abdominal wall was closed with buried 3-0 PDS and 3-0 Monocryl and subcuticular 4-0 Monocryl.    The wound was then dressed with dermabond. ABD's and an abdominal binder were placed. Patient was allowed to wake up, extubated and taken on a stretcher in the flexed position to the recovery room. Family was notified at the end of the case.   The advanced practice practitioner (APP) assisted throughout the case.  The APP was essential in retraction and counter traction when needed to make the case progress smoothly.  This retraction and assistance made it possible to see the tissue plans for the procedure.  The assistance was needed for blood control, tissue re-approximation and assisted with closure of the incision site.

## 2022-06-06 NOTE — Interval H&P Note (Signed)
History and Physical Interval Note:  06/06/2022 9:07 AM  Caitlin Wood  has presented today for surgery, with the diagnosis of panniculitis.  The various methods of treatment have been discussed with the patient and family. After consideration of risks, benefits and other options for treatment, the patient has consented to  Procedure(s): Panniculectomy with possible liposuction (Bilateral) as a surgical intervention.  The patient's history has been reviewed, patient examined, no change in status, stable for surgery.  I have reviewed the patient's chart and labs.  Questions were answered to the patient's satisfaction.     [name]

## 2022-06-06 NOTE — Anesthesia Procedure Notes (Signed)
Procedure Name: Intubation Date/Time: 06/06/2022 9:43 AM  Performed by: Willa Frater, CRNAPre-anesthesia Checklist: Patient identified, Emergency Drugs available, Suction available and Patient being monitored Patient Re-evaluated:Patient Re-evaluated prior to induction Oxygen Delivery Method: Circle system utilized Preoxygenation: Pre-oxygenation with 100% oxygen Induction Type: IV induction Ventilation: Mask ventilation without difficulty Laryngoscope Size: Mac and 3 Grade View: Grade I Tube type: Oral Tube size: 7.0 mm Number of attempts: 1 Airway Equipment and Method: Stylet and Oral airway Placement Confirmation: ETT inserted through vocal cords under direct vision, positive ETCO2 and breath sounds checked- equal and bilateral Secured at: 22 cm Tube secured with: Tape Dental Injury: Teeth and Oropharynx as per pre-operative assessment

## 2022-06-06 NOTE — Discharge Instructions (Addendum)
INSTRUCTIONS FOR AFTER ABDOMINAL SURGERY  You will likely have some questions about what to expect following your operation.  The following information will help you and your family understand what to expect when you get home.  Following these guidelines will help ensure a smooth recovery and reduce risks of complications.  Postoperative instructions include information on: diet, wound care, medications and physical activity.  AFTER SURGERY Expect to go home after the procedure.  In some cases, you may need to spend one night in the hospital for observation.  DIET This surgery does not require a specific diet.  However, the healthier you eat the better your body can heal. It is important to increasing your protein intake.  Limit foods with high sugar and  carbohydrate content.  Focus on vegetables, meat and other protein sources if you are vegan or vegetarian.  If you undergo liposuction during your procedure it is very important to drink 8 oz of water every hour while awake for 2 days.  If your urine is bright yellow, then it is concentrated, and you need to drink more water.  If you find you are persistently nauseated or unable to take in liquids let us know.  NO TOBACCO USE or EXPOSURE.  This will slow your healing process and increase the risk of a wound.  WOUND CARE Leave the abdominal binder in place for 3 days.  Then you can remove it and shower.  Replace the binder or spanx after your shower.   You may have Topifoam or Lipofoam on.  It is soft and spongy and helps keep you from getting creases if you have liposuction.  This can be removed before the shower and then replaced.  If you need more it is available on [place] as lipofoam.  If you have steri-strips / tape directly attached to your skin leave them in place. It is OK to get these wet.  No baths, pools or hot tubs for four weeks. We close your incision to leave the smallest and best-looking scar. No ointment or creams on your incisions  until cleared by your surgeon.  No Neosporin (Too many skin reactions with this one).  After the steri-strips are off can use Mederma or Skinuva and start massaging the scar. Continue to wear the binder/spanx or Ace wrap around the clock, including while sleeping, for 6 weeks. This provides added comfort and helps reduce the fluid accumulation at the surgery site.  ACTIVITY No heavy lifting until cleared by the doctor.  For example, no more than a half-gallon of milk.  It is OK to walk and you are encouraged to move your legs to help decrease your risk of getting a blood clot.  It will also help keep you from getting deconditioned.  Every 1 to 2 hours get up and walk for 5 minutes. This will help with a quicker recovery back to normal.  Let pain be your guide so you don't do too much.     SLEEPING / RESTING Sleeping and resting should be in the jack-knife or bent forward position with your head elevated.  This will help reduce pulling on your abdominal incision.  You can elevate your head and upper back with a few pillows and place a pillow under your knees.  Avoid stomach sleeping for 3 months.   WORK Everyone returns to work at different times. As a rough guide, most people take 1 - 2 weeks off prior to returning to work. If you need documentation for your  job give them to the front staff for processing.  DRIVING Arrange for someone to bring you home from the hospital.  You may be able to drive a few days after surgery but not while taking any narcotics or valium.  This is for your safety as well as others sharing the road with you.  BOWEL MOVEMENTS Constipation can occur after anesthesia and while taking pain medication.  It is important to stay ahead for your comfort.  We recommend taking Milk of Magnesia (2 tablespoons; twice a day) while taking the pain pills.  MEDICATIONS (you may receive and should be started after surgery) At your preoperative visit for you history and physical you were  given the following medications: Antibiotic: Start this medication when you get home and take according to the instructions on the bottle. Zofran 4 mg:  This is to treat nausea and vomiting.  You can take this every 6 hours as needed and only if needed. Norco (hydrocodone/acetaminophen) 5/325 mg:  This is only to be used after you have taken the Motrin or the Tylenol. Every 8 hours as needed.  Over the counter Medication to take: Ibuprofen (Motrin) 600 mg:  Take this every 6 hours.  If you have additional pain then take 500 mg of the Tylenol.  Only take the Norco after you have tried these two. MiraLAX or stool softener of choice: Take this according to the bottle if you take the [place] Call your surgeon's office if any of the following occur:  Fever 101 degrees F or greater  Excessive bleeding or fluid from the incision site.  Pain that increases over time without aid from the medications  Redness, warmth, or pus draining from incision sites  Persistent nausea or inability to take in liquids  Severe misshapen area that underwent the operation.   Post Anesthesia Home Care Instructions  Activity: Get plenty of rest for the remainder of the day. A responsible individual must stay with you for 24 hours following the procedure.  For the next 24 hours, DO NOT: -Drive a car -Paediatric nurse -Drink alcoholic beverages -Take any medication unless instructed by your physician -Make any legal decisions or sign important papers.  Meals: Start with liquid foods such as gelatin or soup. Progress to regular foods as tolerated. Avoid greasy, spicy, heavy foods. If nausea and/or vomiting occur, drink only clear liquids until the nausea and/or vomiting subsides. Call your physician if vomiting continues.  Special Instructions/Symptoms: Your throat may feel dry or sore from the anesthesia or the breathing tube placed in your throat during surgery. If this causes discomfort, gargle  with warm salt water. The discomfort should disappear within 24 hours.  If you had a scopolamine patch placed behind your ear for the management of post- operative nausea and/or vomiting:  1. The medication in the patch is effective for 72 hours, after which it should be removed.  Wrap patch in a tissue and discard in the trash. Wash hands thoroughly with soap and water. 2. You may remove the patch earlier than 72 hours if you experience unpleasant side effects which may include dry mouth, dizziness or visual disturbances. 3. Avoid touching the patch. Wash your hands with soap and water after contact with the patch.     Information for Discharge Teaching: EXPAREL (bupivacaine liposome injectable suspension)   Your surgeon or anesthesiologist gave you EXPAREL(bupivacaine) to help control your pain after surgery.  EXPAREL is a local anesthetic that provides pain relief by  numbing the tissue around the surgical site. EXPAREL is designed to release pain medication over time and can control pain for up to 72 hours. Depending on how you respond to EXPAREL, you may require less pain medication during your recovery.  Possible side effects: Temporary loss of sensation or ability to move in the area where bupivacaine was injected. Nausea, vomiting, constipation Rarely, numbness and tingling in your mouth or lips, lightheadedness, or anxiety may occur. Call your doctor right away if you think you may be experiencing any of these sensations, or if you have other questions regarding possible side effects.  Follow all other discharge instructions given to you by your surgeon or nurse. Eat a healthy diet and drink plenty of water or other fluids.  If you return to the hospital for any reason within 96 hours following the administration of EXPAREL, it is important for health care providers to know that you have received this anesthetic. A teal colored band has been placed on your arm with the date, time and  amount of EXPAREL you have received in order to alert and inform your health care providers. Please leave this armband in place for the full 96 hours following administration, and then you may remove the band.    About my Jackson-Pratt Bulb Drain  What is a Jackson-Pratt bulb? A Jackson-Pratt is a soft, round device used to collect drainage. It is connected to a long, thin drainage catheter, which is held in place by one or two small stiches near your surgical incision site. When the bulb is squeezed, it forms a vacuum, forcing the drainage to empty into the bulb.  Emptying the Jackson-Pratt bulb- To empty the bulb: 1. Release the plug on the top of the bulb. 2. Pour the bulb's contents into a measuring container which your nurse will provide. 3. Record the time emptied and amount of drainage. Empty the drain(s) as often as your     doctor or nurse recommends.  Date                  Time                    Amount (Drain 1)                 Amount (Drain 2)  _____________________________________________________________________  _____________________________________________________________________  _____________________________________________________________________  _____________________________________________________________________  _____________________________________________________________________  _____________________________________________________________________  _____________________________________________________________________  _____________________________________________________________________  Squeezing the Jackson-Pratt Bulb- To squeeze the bulb: 1. Make sure the plug at the top of the bulb is open. 2. Squeeze the bulb tightly in your fist. You will hear air squeezing from the bulb. 3. Replace the plug while the bulb is squeezed. 4. Use a safety pin to attach the bulb to your clothing. This will keep the catheter from     pulling at the bulb insertion  site.  When to call your doctor- Call your doctor if: Drain site becomes red, swollen or hot. You have a fever greater than 101 degrees F. There is oozing at the drain site. Drain falls out (apply a guaze bandage over the drain hole and secure it with tape). Drainage increases daily not related to activity patterns. (You will usually have more drainage when you are active than when you are resting.) Drainage has a bad odor.  Oxycodone given at 12:49pm today

## 2022-06-06 NOTE — Anesthesia Postprocedure Evaluation (Signed)
Anesthesia Post Note  Patient: Caitlin Wood  Procedure(s) Performed: Panniculectomy with liposuction (Bilateral: Abdomen)     Patient location during evaluation: PACU Anesthesia Type: General Level of consciousness: awake and alert Pain management: pain level controlled Vital Signs Assessment: post-procedure vital signs reviewed and stable Respiratory status: spontaneous breathing, nonlabored ventilation, respiratory function stable and patient connected to nasal cannula oxygen Cardiovascular status: blood pressure returned to baseline and stable Postop Assessment: no apparent nausea or vomiting Anesthetic complications: no  No notable events documented.  Last Vitals:  Vitals:   06/06/22 1215 06/06/22 1227  BP:  131/82  Pulse: 69 64  Resp: 16 18  Temp:  (!) 36.3 C  SpO2: 97% 97%    Last Pain:  Vitals:   06/06/22 1227  TempSrc: Oral  PainSc: 3                  Effie Berkshire

## 2022-06-06 NOTE — Transfer of Care (Signed)
Immediate Anesthesia Transfer of Care Note  Patient: Caitlin Wood  Procedure(s) Performed: Panniculectomy with liposuction (Bilateral: Abdomen)  Patient Location: PACU  Anesthesia Type:General  Level of Consciousness: awake, alert , and oriented  Airway & Oxygen Therapy: Patient Spontanous Breathing and Patient connected to face mask oxygen  Post-op Assessment: Report given to RN and Post -op Vital signs reviewed and stable  Post vital signs: Reviewed and stable  Last Vitals:  Vitals Value Taken Time  BP    Temp    Pulse    Resp    SpO2      Last Pain:  Vitals:   06/06/22 0748  TempSrc: Oral  PainSc: 0-No pain      Patients Stated Pain Goal: 3 ([id number])  Complications: No notable events documented.

## 2022-06-06 NOTE — Anesthesia Preprocedure Evaluation (Signed)
Anesthesia Evaluation  Patient identified by MRN, date of birth, ID band Patient awake    Reviewed: Allergy & Precautions, NPO status , Patient's Chart, lab work & pertinent test results  Airway Mallampati: I  TM Distance: >3 FB Neck ROM: Full    Dental  (+) Teeth Intact, Dental Advisory Given   Pulmonary sleep apnea    breath sounds clear to auscultation       Cardiovascular negative cardio ROS  Rhythm:Regular Rate:Normal     Neuro/Psych  PSYCHIATRIC DISORDERS  Depression    negative neurological ROS     GI/Hepatic negative GI ROS, Neg liver ROS,,,  Endo/Other  negative endocrine ROS    Renal/GU negative Renal ROS     Musculoskeletal negative musculoskeletal ROS (+)    Abdominal   Peds  Hematology negative hematology ROS (+)   Anesthesia Other Findings   Reproductive/Obstetrics                             Anesthesia Physical Anesthesia Plan  ASA: 2  Anesthesia Plan: General   Post-op Pain Management: Tylenol PO (pre-op)* and Toradol IV (intra-op)*   Induction: Intravenous  PONV Risk Score and Plan: 4 or greater and Ondansetron, Dexamethasone, Midazolam and Scopolamine patch - Pre-op  Airway Management Planned: LMA and Oral ETT  Additional Equipment: None  Intra-op Plan:   Post-operative Plan: Extubation in OR  Informed Consent: I have reviewed the patients History and Physical, chart, labs and discussed the procedure including the risks, benefits and alternatives for the proposed anesthesia with the patient or authorized representative who has indicated his/her understanding and acceptance.       Plan Discussed with: CRNA  Anesthesia Plan Comments:        Anesthesia Quick Evaluation

## 2022-06-06 NOTE — OR Nursing (Signed)
Pannus discarded per MD.

## 2022-06-07 ENCOUNTER — Encounter (HOSPITAL_BASED_OUTPATIENT_CLINIC_OR_DEPARTMENT_OTHER): Payer: Self-pay | Admitting: Plastic Surgery

## 2022-06-08 ENCOUNTER — Ambulatory Visit (INDEPENDENT_AMBULATORY_CARE_PROVIDER_SITE_OTHER): Payer: Managed Care, Other (non HMO) | Admitting: Physician Assistant

## 2022-06-08 DIAGNOSIS — M793 Panniculitis, unspecified: Secondary | ICD-10-CM

## 2022-06-08 NOTE — Progress Notes (Signed)
This is a 42 year old female who is status post panniculectomy on 06/06/2022 by Dr. Marla Roe. The patient is located in [place], I am currently at our office in [place].  Overall the patient notes she is doing well, no large amount of drain output, pain well-tolerated with pain medicine.  She is having bowel movements and is up and ambulating.  Exam:  Phone encounter, patient speaking in full sentences no acute distress.  Overall the patient is doing very well from a postoperative standpoint, she has a scheduled follow-up on 06/15/2022 with Dr. Marla Roe.  She was given strict return precautions, she verbalized understanding and agreement to today's plan had no further questions or concerns.

## 2022-06-15 ENCOUNTER — Encounter: Payer: Self-pay | Admitting: Plastic Surgery

## 2022-06-15 ENCOUNTER — Ambulatory Visit (INDEPENDENT_AMBULATORY_CARE_PROVIDER_SITE_OTHER): Payer: 59 | Admitting: Plastic Surgery

## 2022-06-15 VITALS — BP 118/77 | HR 72 | Ht 65.0 in | Wt 213.8 lb

## 2022-06-15 DIAGNOSIS — M793 Panniculitis, unspecified: Secondary | ICD-10-CM

## 2022-06-15 NOTE — Progress Notes (Signed)
The patient is a 43 year old female here for follow-up after undergoing a panniculectomy that was over 1900 g and weight.  She is doing really well.  There is no sign of hematoma or seroma.  The drains are in place.  The drain output has been minimal.  I went ahead and took out the left drain.  She can go into a spanks.  That will probably be more comfortable for her we will plan to see her back next week.

## 2022-06-27 ENCOUNTER — Telehealth: Payer: Self-pay | Admitting: Plastic Surgery

## 2022-06-27 NOTE — Telephone Encounter (Signed)
Would like a call back due to excess pain with drain in lower stomach. About 20 cc coming out every 8-10 hrs.

## 2022-06-27 NOTE — Telephone Encounter (Signed)
I spoke with her, drain is bothering her but she does not have concern for infection. She has an appointment on Friday, we will see her then.

## 2022-06-29 ENCOUNTER — Ambulatory Visit (INDEPENDENT_AMBULATORY_CARE_PROVIDER_SITE_OTHER): Payer: Managed Care, Other (non HMO) | Admitting: Surgical

## 2022-06-29 DIAGNOSIS — M793 Panniculitis, unspecified: Secondary | ICD-10-CM

## 2022-06-29 NOTE — Progress Notes (Signed)
42 year old female here for follow-up after panniculectomy on 06/06/2022 with Dr. Ulice Bold.  She is 3 weeks postop.  She had her left JP drain removed at her last appointment on 06/15/2022. She reports that she has had approximately 10 to 12 cc of output from the right JP drain per 12 hours since her last appointment.  She reports otherwise she is feeling well.  She is not having any infectious symptoms, denies fevers or chills.  She is very happy with her outcome so far.  Chaperone present on exam On exam right JP drain insertion site is in place, there is some surrounding erythema that extends approximately 3 cm in each direction surrounding the JP drain insertion site.  There is no purulent drainage.  Abdominal incision is intact and healing well.  I do not appreciate any subcutaneous fluid collection noted palpation.  No erythema or cellulitic changes noted of patient's abdominal area or peri-incisional area.   A/P:  Right JP drain was removed, patient tolerated this well.  We discussed the importance of compressive garments 24/7 to help decrease risk of subcutaneous fluid collections.  Patient was understanding of this.  We plan to see her back in a few weeks for reevaluation.  We discussed to continue to monitor the right JP drain insertion site erythema/irritation, discussed that if it does not improve over the next few days or worsens she may need a short course of antibiotics.  Patient was agreeable.  She understands to call with questions or concerns or significant changes.

## 2022-07-02 ENCOUNTER — Telehealth: Payer: Self-pay | Admitting: *Deleted

## 2022-07-02 NOTE — Progress Notes (Unsigned)
Patient is a pleasant 42 year old female s/p panniculectomy performed 06/06/2022 by Dr. Ulice Bold who presents to clinic for postoperative follow-up.  She was last seen here in clinic on 06/29/2022.  At that time, right-sided JP drain was removed, well-tolerated by patient.  Discussed importance of compressive garments.  Plan was for her to return in a few weeks, but to call the office should she develop any questions or concerns.  Today, retained fluid?***

## 2022-07-02 NOTE — Telephone Encounter (Signed)
Call received from Ms. Mailloux who reports she feels like she is "retaining fluid" in her abdomen s/p panniculectomy surgery on 06/06/22. She had right jp drain removed on 4/12 and reports seeing drainage on the right side of her compression garment since then. Also c/o of "yeast" in and around her belly button. Discussed with Matt who states okay for pt to be seen tomorrow. Pt states she is not able to come in today. Message forwarded to front desk to set up appt.

## 2022-07-03 ENCOUNTER — Ambulatory Visit (INDEPENDENT_AMBULATORY_CARE_PROVIDER_SITE_OTHER): Payer: Managed Care, Other (non HMO) | Admitting: Physician Assistant

## 2022-07-03 DIAGNOSIS — Z9889 Other specified postprocedural states: Secondary | ICD-10-CM

## 2022-07-12 NOTE — Progress Notes (Signed)
42 year old female here for follow-up after panniculectomy with liposuction with Dr. Ulice Bold on 06/06/2022.  She is 5 weeks postop.  She was last seen in the office on 07/03/2022.  She had concerns over possible fluid retention.  Exam was reassuring.  She reports today she is doing well, she has not noticed any increased swelling or the sensation of fluid retention at this point.  She is overall very happy with her postoperative result.  She has no specific questions or concerns today.  Chaperone present on exam On exam entire abdominal incision is intact and healing well.  There is no erythema or cellulitic change.  No tenderness noted palpation.  No ecchymosis. No subcutaneous fluid collections noted palpation.  A/P:  Recommend continue with compressive garments 24/7 for 1 more week, she can then transition to wearing compressive garments only when active for approximately 8 to 10 weeks postoperatively.  We discussed increasing activity as tolerated in 1 week.  No restrictions after 1 week.  Recommend calling with questions or concerns, following up as needed.  She reports photos were taken at the last appointment

## 2022-07-13 ENCOUNTER — Ambulatory Visit (INDEPENDENT_AMBULATORY_CARE_PROVIDER_SITE_OTHER): Payer: 59 | Admitting: Surgical

## 2022-07-13 DIAGNOSIS — Z9889 Other specified postprocedural states: Secondary | ICD-10-CM

## 2022-12-20 ENCOUNTER — Encounter (HOSPITAL_COMMUNITY): Payer: Self-pay | Admitting: *Deleted

## 2023-12-11 ENCOUNTER — Encounter (HOSPITAL_COMMUNITY): Payer: Self-pay | Admitting: *Deleted
